# Patient Record
Sex: Female | Born: 1985 | Race: Black or African American | Hispanic: No | Smoking: Former smoker
Health system: Southern US, Community
[De-identification: ages and names within clinical notes are randomized; demographics above are authoritative.]

## PROBLEM LIST (undated history)

## (undated) ENCOUNTER — Inpatient Hospital Stay (HOSPITAL_COMMUNITY): Payer: Medicaid Other

## (undated) DIAGNOSIS — D219 Benign neoplasm of connective and other soft tissue, unspecified: Secondary | ICD-10-CM

## (undated) DIAGNOSIS — D509 Iron deficiency anemia, unspecified: Secondary | ICD-10-CM

## (undated) DIAGNOSIS — B999 Unspecified infectious disease: Secondary | ICD-10-CM

## (undated) DIAGNOSIS — L709 Acne, unspecified: Secondary | ICD-10-CM

## (undated) DIAGNOSIS — Z9289 Personal history of other medical treatment: Secondary | ICD-10-CM

## (undated) DIAGNOSIS — J9819 Other pulmonary collapse: Secondary | ICD-10-CM

## (undated) DIAGNOSIS — N939 Abnormal uterine and vaginal bleeding, unspecified: Secondary | ICD-10-CM

## (undated) DIAGNOSIS — F419 Anxiety disorder, unspecified: Secondary | ICD-10-CM

## (undated) DIAGNOSIS — E559 Vitamin D deficiency, unspecified: Secondary | ICD-10-CM

## (undated) DIAGNOSIS — M1990 Unspecified osteoarthritis, unspecified site: Secondary | ICD-10-CM

## (undated) DIAGNOSIS — F439 Reaction to severe stress, unspecified: Secondary | ICD-10-CM

## (undated) DIAGNOSIS — N83209 Unspecified ovarian cyst, unspecified side: Secondary | ICD-10-CM

## (undated) DIAGNOSIS — D649 Anemia, unspecified: Secondary | ICD-10-CM

## (undated) DIAGNOSIS — A749 Chlamydial infection, unspecified: Secondary | ICD-10-CM

## (undated) DIAGNOSIS — F32A Depression, unspecified: Secondary | ICD-10-CM

## (undated) DIAGNOSIS — F329 Major depressive disorder, single episode, unspecified: Secondary | ICD-10-CM

## (undated) HISTORY — DX: Abnormal uterine and vaginal bleeding, unspecified: N93.9

---
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
## 2012-12-19 ENCOUNTER — Encounter (HOSPITAL_COMMUNITY): Payer: Self-pay | Admitting: Emergency Medicine

## 2012-12-19 ENCOUNTER — Emergency Department (HOSPITAL_COMMUNITY)
Admission: EM | Admit: 2012-12-19 | Discharge: 2012-12-19 | Disposition: A | Discharge status: Home / Self Care | Payer: 59 | Attending: Emergency Medicine | Admitting: Emergency Medicine

## 2012-12-19 DIAGNOSIS — N760 Acute vaginitis: Secondary | ICD-10-CM | POA: Insufficient documentation

## 2012-12-19 DIAGNOSIS — D509 Iron deficiency anemia, unspecified: Secondary | ICD-10-CM | POA: Insufficient documentation

## 2012-12-19 DIAGNOSIS — Z975 Presence of (intrauterine) contraceptive device: Secondary | ICD-10-CM | POA: Insufficient documentation

## 2012-12-19 DIAGNOSIS — Z3202 Encounter for pregnancy test, result negative: Secondary | ICD-10-CM | POA: Insufficient documentation

## 2012-12-19 DIAGNOSIS — Z880 Allergy status to penicillin: Secondary | ICD-10-CM | POA: Insufficient documentation

## 2012-12-19 DIAGNOSIS — Z8709 Personal history of other diseases of the respiratory system: Secondary | ICD-10-CM | POA: Insufficient documentation

## 2012-12-19 DIAGNOSIS — B9689 Other specified bacterial agents as the cause of diseases classified elsewhere: Secondary | ICD-10-CM

## 2012-12-19 DIAGNOSIS — Z79899 Other long term (current) drug therapy: Secondary | ICD-10-CM | POA: Insufficient documentation

## 2012-12-19 DIAGNOSIS — N390 Urinary tract infection, site not specified: Secondary | ICD-10-CM

## 2012-12-19 DIAGNOSIS — F17200 Nicotine dependence, unspecified, uncomplicated: Secondary | ICD-10-CM | POA: Insufficient documentation

## 2012-12-19 HISTORY — DX: Iron deficiency anemia, unspecified: D50.9

## 2012-12-19 HISTORY — DX: Other pulmonary collapse: J98.19

## 2012-12-19 LAB — URINALYSIS, ROUTINE W REFLEX MICROSCOPIC
Bilirubin Urine: NEGATIVE
Leukocytes, UA: NEGATIVE
Nitrite: POSITIVE — AB
Protein, ur: NEGATIVE mg/dL
Specific Gravity, Urine: 1.023 (ref 1.005–1.030)
Urobilinogen, UA: 0.2 mg/dL (ref 0.0–1.0)
pH: 6.5 (ref 5.0–8.0)

## 2012-12-19 LAB — URINE MICROSCOPIC-ADD ON

## 2012-12-19 LAB — WET PREP, GENITAL: Trich, Wet Prep: NONE SEEN

## 2012-12-19 MED ORDER — NITROFURANTOIN MONOHYD MACRO 100 MG PO CAPS: 100.0000 mg | ORAL_CAPSULE | Freq: Two times a day (BID) | ORAL | Status: DC

## 2012-12-19 MED ORDER — METRONIDAZOLE 500 MG PO TABS: 500.0000 mg | ORAL_TABLET | Freq: Two times a day (BID) | ORAL | Status: DC

## 2012-12-19 MED ORDER — FLUCONAZOLE 150 MG PO TABS: 150.0000 mg | ORAL_TABLET | Freq: Once | ORAL | Status: AC | PRN

## 2012-12-19 NOTE — ED Provider Notes (Signed)
CSN: [id number]     Arrival date & time 12/19/12  1557 History   First MD Initiated Contact with Patient 12/19/12 1933     Chief Complaint  Patient presents with  . Vaginal Discharge   (Consider location/radiation/quality/duration/timing/severity/associated sxs/prior Treatment) Patient is a 27 y.o. female presenting with vaginal discharge.  Vaginal Discharge Quality:  Green Severity:  Moderate Onset quality:  Gradual Duration:  1 month Timing:  Intermittent Progression:  Worsening Chronicity:  New Context: spontaneously   Relieved by:  Nothing Worsened by:  Nothing tried Associated symptoms: abdominal pain (suprapubic), dysuria, urinary frequency and vaginal itching   Associated symptoms: no fever, no nausea and no vomiting     Past Medical History  Diagnosis Date  . Iron deficiency anemia   . Collapsed lung    Past Surgical History  Procedure Laterality Date  . Chest tube insertion     No family history on file. History  Substance Use Topics  . Smoking status: Current Every Day Smoker -- 0.50 packs/day    Types: Cigarettes  . Smokeless tobacco: Not on file  . Alcohol Use: Yes     Comment: socially   OB History   Grav Para Term Preterm Abortions TAB SAB Ect Mult Living                 Review of Systems  Constitutional: Negative for fever.  HENT: Negative for congestion.   Respiratory: Negative for cough and shortness of breath.   Cardiovascular: Negative for chest pain.  Gastrointestinal: Positive for abdominal pain (suprapubic). Negative for nausea, vomiting and diarrhea.  Genitourinary: Positive for dysuria and vaginal discharge.  All other systems reviewed and are negative.    Allergies  Penicillins  Home Medications   Current Outpatient Rx  Name  Route  Sig  Dispense  Refill  . ferrous sulfate 325 (65 FE) MG tablet   Oral   Take 325 mg by mouth daily with breakfast.         . PARAGARD INTRAUTERINE COPPER IU   Intrauterine   by Intrauterine  route.          BP 126/66  Pulse 97  Temp(Src) 98.2 F (36.8 C) (Oral)  Resp 16  SpO2 100%  LMP 11/26/2012 Physical Exam  Nursing note and vitals reviewed. Constitutional: She is oriented to person, place, and time. She appears well-developed and well-nourished. No distress.  HENT:  Head: Normocephalic and atraumatic.  Eyes: Conjunctivae are normal. No scleral icterus.  Neck: Neck supple.  Cardiovascular: Normal rate and intact distal pulses.   Pulmonary/Chest: Effort normal. No stridor. No respiratory distress.  Abdominal: Normal appearance. She exhibits no distension. There is tenderness (mild) in the right lower quadrant, suprapubic area and left lower quadrant. There is no rigidity, no rebound and no guarding.  Genitourinary: Uterus is tender. Cervix exhibits discharge. Cervix exhibits no motion tenderness and no friability. Right adnexum displays tenderness. Right adnexum displays no mass and no fullness. Left adnexum displays tenderness. Left adnexum displays no mass and no fullness.  nonfocal mild pelvic tenderness  Neurological: She is alert and oriented to person, place, and time.  Skin: Skin is warm and dry. No rash noted.  Psychiatric: She has a normal mood and affect. Her behavior is normal.    ED Course  Procedures (including critical care time) Labs Review Labs Reviewed  WET PREP, GENITAL - Abnormal; Notable for the following:    Clue Cells Wet Prep HPF POC FEW (*)    WBC,  Wet Prep HPF POC TOO NUMEROUS TO COUNT (*)    All other components within normal limits  URINALYSIS, ROUTINE W REFLEX MICROSCOPIC - Abnormal; Notable for the following:    APPearance CLOUDY (*)    Nitrite POSITIVE (*)    All other components within normal limits  URINE MICROSCOPIC-ADD ON - Abnormal; Notable for the following:    Bacteria, UA MANY (*)    All other components within normal limits  GC/CHLAMYDIA PROBE AMP  POCT PREGNANCY, URINE   Imaging Review No results found.  EKG  Interpretation   None       MDM   1. UTI (urinary tract infection)   2. BV (bacterial vaginosis)    Pt with vaginal discharge and nonfocal suprapubic pain.  No CMT, no friability.  No focal ovarian tenderness.  Exam not consistent with PID, Ov torsion, TOA, appendicitis, bowel obstruction.  She has a UTI and BV which likely explain her symptoms.  Will treat with Keflex and Metronidazole.      Candyce Churn, MD 12/19/12 2146

## 2012-12-19 NOTE — ED Notes (Signed)
Pt from home c/o brownish, white discharge with "fishy smell." x1 month. Pt adds that she has the Paraguard IUD and is wondering if this may be the cause. Pt also thinks she may have UTI because she c/o dysuria with frequency, and small amt of urinary incontinence when she sneezes ot laughs. Pt denies N/V/D/fever. Pt is A&O and in NAD

## 2012-12-20 LAB — GC/CHLAMYDIA PROBE AMP
CT Probe RNA: NEGATIVE
GC Probe RNA: NEGATIVE

## 2013-07-10 ENCOUNTER — Emergency Department (HOSPITAL_COMMUNITY)
Admission: EM | Admit: 2013-07-10 | Discharge: 2013-07-10 | Disposition: A | Discharge status: Home / Self Care | Payer: Medicaid Other | Attending: Emergency Medicine | Admitting: Emergency Medicine

## 2013-07-10 ENCOUNTER — Encounter (HOSPITAL_COMMUNITY): Payer: Self-pay | Admitting: Emergency Medicine

## 2013-07-10 DIAGNOSIS — D509 Iron deficiency anemia, unspecified: Secondary | ICD-10-CM | POA: Insufficient documentation

## 2013-07-10 DIAGNOSIS — B9689 Other specified bacterial agents as the cause of diseases classified elsewhere: Secondary | ICD-10-CM | POA: Insufficient documentation

## 2013-07-10 DIAGNOSIS — R110 Nausea: Secondary | ICD-10-CM | POA: Insufficient documentation

## 2013-07-10 DIAGNOSIS — Z3202 Encounter for pregnancy test, result negative: Secondary | ICD-10-CM | POA: Insufficient documentation

## 2013-07-10 DIAGNOSIS — K0889 Other specified disorders of teeth and supporting structures: Secondary | ICD-10-CM

## 2013-07-10 DIAGNOSIS — R109 Unspecified abdominal pain: Secondary | ICD-10-CM | POA: Insufficient documentation

## 2013-07-10 DIAGNOSIS — Z79899 Other long term (current) drug therapy: Secondary | ICD-10-CM | POA: Insufficient documentation

## 2013-07-10 DIAGNOSIS — Z880 Allergy status to penicillin: Secondary | ICD-10-CM | POA: Insufficient documentation

## 2013-07-10 DIAGNOSIS — K029 Dental caries, unspecified: Secondary | ICD-10-CM | POA: Insufficient documentation

## 2013-07-10 DIAGNOSIS — Z8709 Personal history of other diseases of the respiratory system: Secondary | ICD-10-CM | POA: Insufficient documentation

## 2013-07-10 DIAGNOSIS — F17200 Nicotine dependence, unspecified, uncomplicated: Secondary | ICD-10-CM | POA: Insufficient documentation

## 2013-07-10 DIAGNOSIS — K089 Disorder of teeth and supporting structures, unspecified: Secondary | ICD-10-CM | POA: Insufficient documentation

## 2013-07-10 DIAGNOSIS — N760 Acute vaginitis: Secondary | ICD-10-CM | POA: Insufficient documentation

## 2013-07-10 DIAGNOSIS — A499 Bacterial infection, unspecified: Secondary | ICD-10-CM | POA: Insufficient documentation

## 2013-07-10 LAB — URINE MICROSCOPIC-ADD ON

## 2013-07-10 LAB — URINALYSIS, ROUTINE W REFLEX MICROSCOPIC
Bilirubin Urine: NEGATIVE
GLUCOSE, UA: NEGATIVE mg/dL
Ketones, ur: NEGATIVE mg/dL
Leukocytes, UA: NEGATIVE
Nitrite: NEGATIVE
Protein, ur: NEGATIVE mg/dL
SPECIFIC GRAVITY, URINE: 1.013 (ref 1.005–1.030)
Urobilinogen, UA: 0.2 mg/dL (ref 0.0–1.0)
pH: 5.5 (ref 5.0–8.0)

## 2013-07-10 LAB — WET PREP, GENITAL
Trich, Wet Prep: NONE SEEN
YEAST WET PREP: NONE SEEN

## 2013-07-10 LAB — HIV ANTIBODY (ROUTINE TESTING W REFLEX): HIV: NONREACTIVE

## 2013-07-10 LAB — PREGNANCY, URINE: Preg Test, Ur: NEGATIVE

## 2013-07-10 MED ORDER — ACIDOPHILUS PROBIOTIC 100 MG PO CAPS: 100.0000 mg | ORAL_CAPSULE | Freq: Every day | ORAL | Status: DC

## 2013-07-10 MED ORDER — LIDOCAINE VISCOUS 2 % MT SOLN
15.0000 mL | Freq: Once | OROMUCOSAL | Status: AC
  Administered 2013-07-10: 15 mL via OROMUCOSAL
  Filled 2013-07-10: qty 15

## 2013-07-10 MED ORDER — LIDOCAINE VISCOUS 2 % MT SOLN: 20.0000 mL | OROMUCOSAL | Status: DC | PRN

## 2013-07-10 MED ORDER — NAPROXEN 500 MG PO TABS: 500.0000 mg | ORAL_TABLET | Freq: Two times a day (BID) | ORAL | Status: DC

## 2013-07-10 MED ORDER — METRONIDAZOLE 500 MG PO TABS: 500.0000 mg | ORAL_TABLET | Freq: Two times a day (BID) | ORAL | Status: DC

## 2013-07-10 MED ORDER — CLINDAMYCIN HCL 150 MG PO CAPS: 300.0000 mg | ORAL_CAPSULE | Freq: Three times a day (TID) | ORAL | Status: DC

## 2013-07-10 NOTE — ED Notes (Signed)
Multiple complaints. Pt reports pain to both sides of abd for several days. Having foul smelling vaginal discharge and nausea. Having dental pain, headaches and sore throat.

## 2013-07-10 NOTE — Discharge Instructions (Signed)
Please follow up with your primary care physician in 1-2 days. If you do not have one please call the [facility] number listed above. Please follow up with Ob/Gyn to schedule a follow up appointment. Please follow up with Dr. Radford Pax, dentistry, to schedule a follow up appointment.    Abdominal Pain, Women Abdominal (stomach, pelvic, or belly) pain can be caused by many things. It is important to tell your doctor:  The location of the pain.  Does it come and go or is it present all the time?  Are there things that start the pain (eating certain foods, exercise)?  Are there other symptoms associated with the pain (fever, nausea, vomiting, diarrhea)? All of this is helpful to know when trying to find the cause of the pain. CAUSES   Stomach: virus or bacteria infection, or ulcer.  Intestine: appendicitis (inflamed appendix), regional ileitis (Crohn's disease), ulcerative colitis (inflamed colon), irritable bowel syndrome, diverticulitis (inflamed diverticulum of the colon), or cancer of the stomach or intestine.  Gallbladder disease or stones in the gallbladder.  Kidney disease, kidney stones, or infection.  Pancreas infection or cancer.  Fibromyalgia (pain disorder).  Diseases of the female organs:  Uterus: fibroid (non-cancerous) tumors or infection.  Fallopian tubes: infection or tubal pregnancy.  Ovary: cysts or tumors.  Pelvic adhesions (scar tissue).  Endometriosis (uterus lining tissue growing in the pelvis and on the pelvic organs).  Pelvic congestion syndrome (female organs filling up with blood just before the menstrual period).  Pain with the menstrual period.  Pain with ovulation (producing an egg).  Pain with an IUD (intrauterine device, birth control) in the uterus.  Cancer of the female organs.  Functional pain (pain not caused by a disease, may improve without treatment).  Psychological pain.  Depression. DIAGNOSIS  Your doctor  will decide the seriousness of your pain by doing an examination.  Blood tests.  X-rays.  Ultrasound.  CT scan (computed tomography, special type of X-ray).  MRI (magnetic resonance imaging).  Cultures, for infection.  Barium enema (dye inserted in the large intestine, to better view it with X-rays).  Colonoscopy (looking in intestine with a lighted tube).  Laparoscopy (minor surgery, looking in abdomen with a lighted tube).  Major abdominal exploratory surgery (looking in abdomen with a large incision). TREATMENT  The treatment will depend on the cause of the pain.   Many cases can be observed and treated at home.  Over-the-counter medicines recommended by your caregiver.  Prescription medicine.  Antibiotics, for infection.  Birth control pills, for painful periods or for ovulation pain.  Hormone treatment, for endometriosis.  Nerve blocking injections.  Physical therapy.  Antidepressants.  Counseling with a psychologist or psychiatrist.  Minor or major surgery. HOME CARE INSTRUCTIONS   Do not take laxatives, unless directed by your caregiver.  Take over-the-counter pain medicine only if ordered by your caregiver. Do not take aspirin because it can cause an upset stomach or bleeding.  Try a clear liquid diet (broth or water) as ordered by your caregiver. Slowly move to a bland diet, as tolerated, if the pain is related to the stomach or intestine.  Have a thermometer and take your temperature several times a day, and record it.  Bed rest and sleep, if it helps the pain.  Avoid sexual intercourse, if it causes pain.  Avoid stressful situations.  Keep your follow-up appointments and tests, as your caregiver orders.  If the pain does not go away with medicine or surgery,  you may try:  Acupuncture.  Relaxation exercises (yoga, meditation).  Group therapy.  Counseling. SEEK MEDICAL CARE IF:   You notice certain foods cause stomach pain.  Your  home care treatment is not helping your pain.  You need stronger pain medicine.  You want your IUD removed.  You feel faint or lightheaded.  You develop nausea and vomiting.  You develop a rash.  You are having side effects or an allergy to your medicine. SEEK IMMEDIATE MEDICAL CARE IF:   Your pain does not go away or gets worse.  You have a fever.  Your pain is felt only in portions of the abdomen. The right side could possibly be appendicitis. The left lower portion of the abdomen could be colitis or diverticulitis.  You are passing blood in your stools (bright red or black tarry stools, with or without vomiting).  You have blood in your urine.  You develop chills, with or without a fever.  You pass out. MAKE SURE YOU:   Understand these instructions.  Will watch your condition.  Will get help right away if you are not doing well or get worse. Document Released: 12/01/2006 Document Revised: 04/28/2011 Document Reviewed: 12/21/2008 [facility] Patient Information 2014 [place], [place]. Bacterial Vaginosis Bacterial vaginosis is a vaginal infection that occurs when the normal balance of bacteria in the vagina is disrupted. It results from an overgrowth of certain bacteria. This is the most common vaginal infection in women of childbearing age. Treatment is important to prevent complications, especially in pregnant women, as it can cause a premature delivery. CAUSES  Bacterial vaginosis is caused by an increase in harmful bacteria that are normally present in smaller amounts in the vagina. Several different kinds of bacteria can cause bacterial vaginosis. However, the reason that the condition develops is not fully understood. RISK FACTORS Certain activities or behaviors can put you at an increased risk of developing bacterial vaginosis, including:  Having a new sex partner or multiple sex partners.  Douching.  Using an intrauterine device (IUD) for contraception. Women do  not get bacterial vaginosis from toilet seats, bedding, swimming pools, or contact with objects around them. SIGNS AND SYMPTOMS  Some women with bacterial vaginosis have no signs or symptoms. Common symptoms include:  Grey vaginal discharge.  A fishlike odor with discharge, especially after sexual intercourse.  Itching or burning of the vagina and vulva.  Burning or pain with urination. DIAGNOSIS  Your health care provider will take a medical history and examine the vagina for signs of bacterial vaginosis. A sample of vaginal fluid may be taken. Your health care provider will look at this sample under a microscope to check for bacteria and abnormal cells. A vaginal pH test may also be done.  TREATMENT  Bacterial vaginosis may be treated with antibiotic medicines. These may be given in the form of a pill or a vaginal cream. A second round of antibiotics may be prescribed if the condition comes back after treatment.  HOME CARE INSTRUCTIONS   Only take over-the-counter or prescription medicines as directed by your health care provider.  If antibiotic medicine was prescribed, take it as directed. Make sure you finish it even if you start to feel better.  Do not have sex until treatment is completed.  Tell all sexual partners that you have a vaginal infection. They should see their health care provider and be treated if they have problems, such as a mild rash or itching.  Practice safe sex by using condoms and  only having one sex partner. SEEK MEDICAL CARE IF:   Your symptoms are not improving after 3 days of treatment.  You have increased discharge or pain.  You have a fever. MAKE SURE YOU:   Understand these instructions.  Will watch your condition.  Will get help right away if you are not doing well or get worse. FOR MORE INFORMATION  Centers for Disease Control and Prevention, Division of STD Prevention: Appraiser[employer]raud[employer]fi American Sexual Health Association (ASHA):  www.ashastd.org  Document Released: 02/03/2005 Document Revised: 11/24/2012 Document Reviewed: 09/15/2012 [facility] Patient Information 2014 [place]. Dental Pain A tooth ache may be caused by cavities (tooth decay). Cavities expose the nerve of the tooth to air and hot or cold temperatures. It may come from an infection or abscess (also called a boil or furuncle) around your tooth. It is also often caused by dental caries (tooth decay). This causes the pain you are having. DIAGNOSIS  Your caregiver can diagnose this problem by exam. TREATMENT   If caused by an infection, it may be treated with medications which kill germs (antibiotics) and pain medications as prescribed by your caregiver. Take medications as directed.  Only take over-the-counter or prescription medicines for pain, discomfort, or fever as directed by your caregiver.  Whether the tooth ache today is caused by infection or dental disease, you should see your dentist as soon as possible for further care. SEEK MEDICAL CARE IF: The exam and treatment you received today has been provided on an emergency basis only. This is not a substitute for complete medical or dental care. If your problem worsens or new problems (symptoms) appear, and you are unable to meet with your dentist, call or return to this location. SEEK IMMEDIATE MEDICAL CARE IF:   You have a fever.  You develop redness and swelling of your face, jaw, or neck.  You are unable to open your mouth.  You have severe pain uncontrolled by pain medicine. MAKE SURE YOU:   Understand these instructions.  Will watch your condition.  Will get help right away if you are not doing well or get worse. Document Released: 02/03/2005 Document Revised: 04/28/2011 Document Reviewed: 09/22/2007 [facility] Patient Information 2014 [place].

## 2013-07-10 NOTE — ED Provider Notes (Signed)
CSN: [id number]     Arrival date & time 07/10/13  1126 History   First MD Initiated Contact with Patient 07/10/13 1156     Chief Complaint  Patient presents with  . Abdominal Pain  . Vaginal Discharge     (Consider location/radiation/quality/duration/timing/severity/associated sxs/prior Treatment) HPI Comments: Patient is a G75 P737 28 year old female presenting to the emergency department for 2 complaints. Patient's first complaint is 4 days of lower abdominal cramping with associated foul smelling vaginal discharge and nausea. Alleviating factors: none. Aggravating factors: none. Medications tried prior to arrival: Motrin. Patient states he has a history of bacterial vaginosis infections and this does not seem similar. Patient has a ParaGard IUD placed (2013). No recent unprotected sexual intercourse with a partner of unknown status. Patient's second complaint is one month of gradually worsening left lower dental pain. Patient states broke a filling time and since then has had increased piercing throbbing pain with radiation to the throat and is causing a headache. Alleviating factors: Motrin. Aggravating factors: eating and drinking. Medications tried prior to arrival: Motrin, Naproxen. Denies any fevers, chills, vomiting, diarrhea, constipation. No abdominal surgical history. LMP beginning of May.    Patient is a 28 y.o. female presenting with abdominal pain and vaginal discharge.  Abdominal Pain Associated symptoms: nausea and vaginal discharge   Associated symptoms: no chills, no constipation, no diarrhea, no dysuria, no fever, no hematuria, no shortness of breath and no vomiting   Vaginal Discharge Associated symptoms: abdominal pain and nausea   Associated symptoms: no dysuria, no fever and no vomiting     Past Medical History  Diagnosis Date  . Iron deficiency anemia   . Collapsed lung    Past Surgical History  Procedure Laterality Date  . Chest tube insertion     History  reviewed. No pertinent family history. History  Substance Use Topics  . Smoking status: Current Every Day Smoker -- 0.50 packs/day    Types: Cigarettes  . Smokeless tobacco: Not on file  . Alcohol Use: Yes     Comment: socially   OB History   Grav Para Term Preterm Abortions TAB SAB Ect Mult Living                 Review of Systems  Constitutional: Negative for fever and chills.  Respiratory: Negative for shortness of breath.   Gastrointestinal: Positive for nausea and abdominal pain. Negative for vomiting, diarrhea and constipation.  Genitourinary: Positive for vaginal discharge. Negative for dysuria, urgency, hematuria, flank pain and vaginal pain.  All other systems reviewed and are negative.     Allergies  Penicillins  Home Medications   Prior to Admission medications   Medication Sig Start Date End Date Taking? Authorizing Provider  ferrous sulfate 325 (65 FE) MG tablet Take 325 mg by mouth daily with breakfast.    Historical Provider, MD  metroNIDAZOLE (FLAGYL) 500 MG tablet Take 1 tablet (500 mg total) by mouth 2 (two) times daily. 12/19/12   Houston Siren III, MD  nitrofurantoin, macrocrystal-monohydrate, (MACROBID) 100 MG capsule Take 1 capsule (100 mg total) by mouth 2 (two) times daily. 12/19/12   Houston Siren III, MD  PARAGARD INTRAUTERINE COPPER IU by Intrauterine route.    Historical Provider, MD   BP 142/71  Pulse 90  Temp(Src) 98.9 F (37.2 C) (Oral)  Resp 18  Ht 5\' 5"  (1.651 m)  SpO2 100%  LMP 06/19/2013 Physical Exam  Nursing note and vitals reviewed. Constitutional: She is oriented to person,  place, and time. She appears well-developed and well-nourished. No distress.  HENT:  Head: Normocephalic and atraumatic.  Right Ear: Hearing, tympanic membrane, external ear and ear canal normal.  Left Ear: Hearing, tympanic membrane, external ear and ear canal normal.  Nose: Nose normal.  Mouth/Throat: Uvula is midline, oropharynx is clear and  moist and mucous membranes are normal. No trismus in the jaw. Abnormal dentition. Dental caries present. No dental abscesses or uvula swelling. No oropharyngeal exudate.    Eyes: Conjunctivae are normal.  Neck: Normal range of motion. Neck supple.  Cardiovascular: Normal rate, regular rhythm and normal heart sounds.   Pulmonary/Chest: Effort normal and breath sounds normal. No respiratory distress.  Abdominal: Soft. Bowel sounds are normal. She exhibits no distension. There is no tenderness. There is no rebound and no guarding.  Musculoskeletal: Normal range of motion. She exhibits no edema.  Neurological: She is alert and oriented to person, place, and time.  Skin: Skin is warm and dry. She is not diaphoretic.  Psychiatric: She has a normal mood and affect.   Exam performed by Harlow Mares,  exam chaperoned Date: 07/10/2013 Pelvic exam: normal external genitalia without evidence of trauma. VULVA: normal appearing vulva with no masses, tenderness or lesion. VAGINA: normal appearing vagina with normal color and discharge, no lesions. CERVIX: normal appearing cervix without lesions, cervical motion tenderness absent, cervical os closed with out purulent discharge; vaginal discharge - copious, green and malodorous, Wet prep and DNA probe for chlamydia and GC obtained.   ADNEXA: normal adnexa in size, nontender and no masses UTERUS: uterus is normal size, shape, consistency and nontender.   ED Course  Procedures (including critical care time) Medications  lidocaine (XYLOCAINE) 2 % viscous mouth solution 15 mL (15 mLs Mouth/Throat Given 07/10/13 1316)    Labs Review Labs Reviewed  WET PREP, GENITAL - Abnormal; Notable for the following:    Clue Cells Wet Prep HPF POC MANY (*)    WBC, Wet Prep HPF POC MODERATE (*)    All other components within normal limits  URINALYSIS, ROUTINE W REFLEX MICROSCOPIC - Abnormal; Notable for the following:    APPearance CLOUDY (*)    Hgb urine  dipstick SMALL (*)    All other components within normal limits  URINE MICROSCOPIC-ADD ON - Abnormal; Notable for the following:    Squamous Epithelial / LPF MANY (*)    Bacteria, UA FEW (*)    All other components within normal limits  GC/CHLAMYDIA PROBE AMP  PREGNANCY, URINE  HIV ANTIBODY (ROUTINE TESTING)    Imaging Review No results found.   EKG Interpretation None      MDM   Final diagnoses:  Bacterial vaginosis  Pain, dental  Abdominal pain    Filed Vitals:   07/10/13 1400  BP: 142/71  Pulse: 90  Temp:   Resp: 18   Afebrile, NAD, non-toxic appearing, AAOx4.   1) Abdominal pain: Patient has not been sepsis research criteria. Patient is not ill-appearing. Abdomen is soft, nontender, nondistended. No peritoneal signs. No focal tenderness to suggest appendicitis. o indication of appendicitis, bowel obstruction, bowel perforation, cholecystitis, diverticulitis, PID or ectopic pregnancy. Pelvic exam with copious green discharge. There is no cervical motion tenderness, adnexal fullness or tenderness. Cervical os is closed. No indications of pelvic inflammatory disease. Patient will risk for GC/Chlamydia, the test is pending will not prophylactically treat this time. Wet prep does reveal bacterial vaginosis. Advised patient f/u with Ob/Gyn. Discussed that HIV, GC/Chlamydia will take 48 hours to  return and she would be notified of any positive results at that time and would need to return for treatment. She is agreeable to this plan.   2) Dental pain: Patient with toothache.  No gross abscess.  Exam unconcerning for Ludwig's angina or spread of infection.  Will treat with clindamycin d/t penicillin alelrgy and pain medicine.  Urged patient to follow-up with dentist.     Patient discharged home with symptomatic treatment and given strict instructions for follow-up with their primary care physician.  I have also discussed reasons to return immediately to the ER.  Patient expresses  understanding and agrees with plan.      Harlow Mares, PA-C 07/10/13 1712

## 2013-07-11 LAB — GC/CHLAMYDIA PROBE AMP
CT Probe RNA: NEGATIVE
GC Probe RNA: NEGATIVE

## 2013-07-13 NOTE — ED Provider Notes (Signed)
Medical screening examination/treatment/procedure(s) were performed by non-physician practitioner and as supervising physician I was immediately available for consultation/collaboration.   EKG Interpretation None       Pete Schnitzer, MD 07/13/13 0702 

## 2014-02-21 ENCOUNTER — Emergency Department (HOSPITAL_COMMUNITY)
Admission: EM | Admit: 2014-02-21 | Discharge: 2014-02-21 | Disposition: A | Discharge status: Home / Self Care | Payer: Medicaid Other | Attending: Emergency Medicine | Admitting: Emergency Medicine

## 2014-02-21 ENCOUNTER — Encounter (HOSPITAL_COMMUNITY): Payer: Self-pay | Admitting: Emergency Medicine

## 2014-02-21 DIAGNOSIS — Z880 Allergy status to penicillin: Secondary | ICD-10-CM | POA: Insufficient documentation

## 2014-02-21 DIAGNOSIS — Z791 Long term (current) use of non-steroidal anti-inflammatories (NSAID): Secondary | ICD-10-CM | POA: Diagnosis not present

## 2014-02-21 DIAGNOSIS — Z862 Personal history of diseases of the blood and blood-forming organs and certain disorders involving the immune mechanism: Secondary | ICD-10-CM | POA: Diagnosis not present

## 2014-02-21 DIAGNOSIS — R05 Cough: Secondary | ICD-10-CM | POA: Diagnosis present

## 2014-02-21 DIAGNOSIS — Z720 Tobacco use: Secondary | ICD-10-CM | POA: Diagnosis not present

## 2014-02-21 DIAGNOSIS — H5713 Ocular pain, bilateral: Secondary | ICD-10-CM | POA: Diagnosis not present

## 2014-02-21 DIAGNOSIS — J069 Acute upper respiratory infection, unspecified: Secondary | ICD-10-CM | POA: Diagnosis not present

## 2014-02-21 DIAGNOSIS — Z79899 Other long term (current) drug therapy: Secondary | ICD-10-CM | POA: Insufficient documentation

## 2014-02-21 DIAGNOSIS — Z792 Long term (current) use of antibiotics: Secondary | ICD-10-CM | POA: Insufficient documentation

## 2014-02-21 MED ORDER — ALBUTEROL SULFATE HFA 108 (90 BASE) MCG/ACT IN AERS
2.0000 | INHALATION_SPRAY | Freq: Once | RESPIRATORY_TRACT | Status: AC
  Administered 2014-02-21: 2 via RESPIRATORY_TRACT
  Filled 2014-02-21: qty 6.7

## 2014-02-21 MED ORDER — AZITHROMYCIN 250 MG PO TABS: 250.0000 mg | ORAL_TABLET | Freq: Every day | ORAL | Status: DC

## 2014-02-21 MED ORDER — DEXTROMETHORPHAN POLISTIREX 30 MG/5ML PO LQCR: 30.0000 mg | ORAL | Status: DC | PRN

## 2014-02-21 NOTE — ED Provider Notes (Signed)
CSN: [id number]     Arrival date & time 02/21/14  9147 History  This chart was scribed for non-physician practitioner, Alvina Chou, PA-C, working with Virgel Manifold, MD, by Stephania Fragmin, ED Scribe. This patient was seen in room TR07C/TR07C and the patient's care was started at 9:32 AM.    Chief Complaint  Patient presents with  . URI    Patient is a 29 y.o. female presenting with URI. The history is provided by the patient and medical records. No language interpreter was used.  URI Presenting symptoms: congestion, cough, fatigue and sore throat   Presenting symptoms: no fever   Associated symptoms: headaches      HPI Comments: Frances Pearson is a 29 y.o. female who presents to the Emergency Department complaining of constant productive cough with brown-yellow sputum and nasal congestion with yellow-green discharge that began 5 days ago. Patient originally had a sore throat, headache, and fatigue which all resolved after 1-2 days. She complains of associated pain behind her eyes, as well as wheezing. She has tried Mucinex DM with minimal relief. Patient also reports having mold in her house, suspecting that they may have contributed to her infection. She is a current smoker (0.5 PPD, per medical records). She denies fever. She also denies a history of asthma.   Past Medical History  Diagnosis Date  . Iron deficiency anemia   . Collapsed lung    Past Surgical History  Procedure Laterality Date  . Chest tube insertion     History reviewed. No pertinent family history. History  Substance Use Topics  . Smoking status: Current Every Day Smoker -- 0.50 packs/day    Types: Cigarettes  . Smokeless tobacco: Not on file  . Alcohol Use: Yes     Comment: socially   OB History    No data available     Review of Systems  Constitutional: Positive for fatigue. Negative for fever.  HENT: Positive for congestion and sore throat.   Respiratory: Positive for cough.   Neurological: Positive for  headaches.  All other systems reviewed and are negative.     Allergies  Penicillins  Home Medications   Prior to Admission medications   Medication Sig Start Date End Date Taking? Authorizing Provider  clindamycin (CLEOCIN) 150 MG capsule Take 2 capsules (300 mg total) by mouth 3 (three) times daily. May dispense as 150mg  capsules 07/10/13   Jennifer L Piepenbrink, PA-C  diphenhydrAMINE (BENADRYL) 25 MG tablet Take 50 mg by mouth every 6 (six) hours as needed.    Historical Provider, MD  ibuprofen (ADVIL,MOTRIN) 200 MG tablet Take 600 mg by mouth every 6 (six) hours as needed.    Historical Provider, MD  Lactobacillus (ACIDOPHILUS PROBIOTIC) 100 MG CAPS Take 1 capsule (100 mg total) by mouth daily. 07/10/13   Jennifer L Piepenbrink, PA-C  lidocaine (XYLOCAINE) 2 % solution Use as directed 20 mLs in the mouth or throat as needed for mouth pain. 07/10/13   Jennifer L Piepenbrink, PA-C  metroNIDAZOLE (FLAGYL) 500 MG tablet Take 1 tablet (500 mg total) by mouth 2 (two) times daily. 07/10/13   Jennifer L Piepenbrink, PA-C  Multiple Vitamin (MULTIVITAMIN WITH MINERALS) TABS tablet Take 1 tablet by mouth daily.    Historical Provider, MD  naproxen (NAPROSYN) 500 MG tablet Take 1 tablet (500 mg total) by mouth 2 (two) times daily with a meal. 07/10/13   Jennifer L Piepenbrink, PA-C  naproxen sodium (ANAPROX) 220 MG tablet Take 220 mg by mouth 2 (two) times  daily with a meal.    Historical Provider, MD  PARAGARD INTRAUTERINE COPPER IU by Intrauterine route.    Historical Provider, MD   BP 112/74 mmHg  Pulse 82  Temp(Src) 97.4 F (36.3 C) (Oral)  Resp 18  SpO2 100%  LMP 02/18/2014 Physical Exam  Constitutional: She is oriented to person, place, and time. She appears well-developed and well-nourished. No distress.  HENT:  Head: Normocephalic and atraumatic.  Mouth/Throat: Oropharynx is clear and moist. No oropharyngeal exudate.  Eyes: Conjunctivae and EOM are normal.  Neck: Neck supple. No  tracheal deviation present.  Cardiovascular: Normal rate.   Pulmonary/Chest: Effort normal. No respiratory distress. She has wheezes.  Mild inspiratory wheezes in bilateral apices. Prominent inspiratory wheezing in bilateral bases.   Abdominal: Soft.  Musculoskeletal: Normal range of motion.  Neurological: She is alert and oriented to person, place, and time.  Skin: Skin is warm and dry.  Psychiatric: She has a normal mood and affect. Her behavior is normal.  Nursing note and vitals reviewed.   ED Course  Procedures (including critical care time)  DIAGNOSTIC STUDIES: Oxygen Saturation is 100% on room air, normal by my interpretation.    COORDINATION OF CARE: 9:35 AM - Discussed treatment plan with pt at bedside which includes antibiotics in the case of infection, and an inhaler, and pt agreed to plan.   MDM   Final diagnoses:  URI (upper respiratory infection)   Patient will have azithromycin for sinus infection. Patient will have delsym for cough. Vitals stable and patient afebrile. No further evaluation needed at this time.   I personally performed the services described in this documentation, which was scribed in my presence. The recorded information has been reviewed and is accurate.     Alvina Chou, PA-C 02/21/14 [place], MD 02/22/14 [phone]

## 2014-02-21 NOTE — ED Notes (Addendum)
PT reports cough, nasal congestion. Denies fevers. Yellow phlegm. Headache and pain behind eyes.

## 2014-02-21 NOTE — ED Notes (Signed)
Cough and congestion x 1 week. Productive cough, green/yellow sputum. Denies CP.

## 2014-02-21 NOTE — Discharge Instructions (Signed)
Take Azithromycin as directed until gone. Refer to attached documents for more information. Take delsym as needed for cough.

## 2014-06-11 ENCOUNTER — Encounter (HOSPITAL_COMMUNITY): Payer: Self-pay | Admitting: *Deleted

## 2014-06-11 ENCOUNTER — Emergency Department (HOSPITAL_COMMUNITY)
Admission: EM | Admit: 2014-06-11 | Discharge: 2014-06-11 | Disposition: A | Discharge status: Home / Self Care | Payer: Medicaid Other | Attending: Emergency Medicine | Admitting: Emergency Medicine

## 2014-06-11 DIAGNOSIS — B9689 Other specified bacterial agents as the cause of diseases classified elsewhere: Secondary | ICD-10-CM

## 2014-06-11 DIAGNOSIS — Z79899 Other long term (current) drug therapy: Secondary | ICD-10-CM | POA: Insufficient documentation

## 2014-06-11 DIAGNOSIS — N898 Other specified noninflammatory disorders of vagina: Secondary | ICD-10-CM | POA: Diagnosis present

## 2014-06-11 DIAGNOSIS — Z3202 Encounter for pregnancy test, result negative: Secondary | ICD-10-CM | POA: Insufficient documentation

## 2014-06-11 DIAGNOSIS — Z880 Allergy status to penicillin: Secondary | ICD-10-CM | POA: Insufficient documentation

## 2014-06-11 DIAGNOSIS — Z792 Long term (current) use of antibiotics: Secondary | ICD-10-CM | POA: Diagnosis not present

## 2014-06-11 DIAGNOSIS — Z862 Personal history of diseases of the blood and blood-forming organs and certain disorders involving the immune mechanism: Secondary | ICD-10-CM | POA: Insufficient documentation

## 2014-06-11 DIAGNOSIS — N760 Acute vaginitis: Secondary | ICD-10-CM | POA: Insufficient documentation

## 2014-06-11 DIAGNOSIS — Z8709 Personal history of other diseases of the respiratory system: Secondary | ICD-10-CM | POA: Insufficient documentation

## 2014-06-11 DIAGNOSIS — Z791 Long term (current) use of non-steroidal anti-inflammatories (NSAID): Secondary | ICD-10-CM | POA: Insufficient documentation

## 2014-06-11 DIAGNOSIS — Z720 Tobacco use: Secondary | ICD-10-CM | POA: Insufficient documentation

## 2014-06-11 LAB — URINALYSIS, ROUTINE W REFLEX MICROSCOPIC
BILIRUBIN URINE: NEGATIVE
GLUCOSE, UA: NEGATIVE mg/dL
Hgb urine dipstick: NEGATIVE
KETONES UR: NEGATIVE mg/dL
Leukocytes, UA: NEGATIVE
NITRITE: POSITIVE — AB
Protein, ur: NEGATIVE mg/dL
SPECIFIC GRAVITY, URINE: 1.028 (ref 1.005–1.030)
Urobilinogen, UA: 0.2 mg/dL (ref 0.0–1.0)
pH: 5.5 (ref 5.0–8.0)

## 2014-06-11 LAB — WET PREP, GENITAL
Trich, Wet Prep: NONE SEEN
Yeast Wet Prep HPF POC: NONE SEEN

## 2014-06-11 LAB — POC URINE PREG, ED: PREG TEST UR: NEGATIVE

## 2014-06-11 LAB — URINE MICROSCOPIC-ADD ON

## 2014-06-11 MED ORDER — METRONIDAZOLE 0.75 % EX GEL: 1.0000 "application " | Freq: Two times a day (BID) | CUTANEOUS | Status: AC

## 2014-06-11 MED ORDER — FLUCONAZOLE 150 MG PO TABS: 150.0000 mg | ORAL_TABLET | Freq: Once | ORAL | Status: DC

## 2014-06-11 NOTE — ED Notes (Signed)
Pt assessed and no acute distress. Aware of waiting time.

## 2014-06-11 NOTE — ED Notes (Signed)
Urine sent to mini lab.

## 2014-06-11 NOTE — Discharge Instructions (Signed)
Please follow the directions provided.  Be sure to follow-up with your primary care provider to ensure you are getting better.  Use the metronidazole cream as directed.  Don't hesitate to return for any new, worsening or concerning symptoms.    SEEK MEDICAL CARE IF:  Your symptoms are not improving after 3 days of treatment.  You have increased discharge or pain.  You have a fever.

## 2014-06-11 NOTE — ED Notes (Signed)
Pt reports foul vaginal discharge and odor. History of BV. Pt also thinks she has ear infection. No distress noted at triage.

## 2014-06-11 NOTE — ED Notes (Signed)
Pelvic Cart Set Up at bedside.

## 2014-06-11 NOTE — ED Provider Notes (Signed)
CSN: [id number]     Arrival date & time 06/11/14  1322 History   First MD Initiated Contact with Patient 06/11/14 1605     Chief Complaint  Patient presents with  . Vaginal Discharge   (Consider location/radiation/quality/duration/timing/severity/associated sxs/prior Treatment) HPI  Frances Pearson is a 29 yo female presenting with report of vaginal discharge.  She is married but currently separated and has not been with another partner in the last month since the separation.  She states the vaginal discharge and odor has been intermittently ongoing for several months but this most recent episode started appr 1 week ago.  She describes the odor as a "fishy" smell and reports the discharge as whitish brown. She has a paragard IUD and her LMP ended 3 days ago. She denies any abd pain or vaginal pain.  She also denies fever,s chills, nausea, vomiting or diarrhea.   Past Medical History  Diagnosis Date  . Iron deficiency anemia   . Collapsed lung    Past Surgical History  Procedure Laterality Date  . Chest tube insertion     History reviewed. No pertinent family history. History  Substance Use Topics  . Smoking status: Current Every Day Smoker -- 0.50 packs/day    Types: Cigarettes  . Smokeless tobacco: Not on file  . Alcohol Use: Yes     Comment: socially   OB History    No data available     Review of Systems  Constitutional: Negative for fever and chills.  HENT: Negative for sore throat.   Eyes: Negative for visual disturbance.  Respiratory: Negative for cough and shortness of breath.   Cardiovascular: Negative for chest pain and leg swelling.  Gastrointestinal: Negative for nausea, vomiting and diarrhea.  Genitourinary: Positive for vaginal discharge. Negative for dysuria, vaginal pain and pelvic pain.  Musculoskeletal: Negative for myalgias.  Skin: Negative for rash.  Neurological: Negative for weakness, numbness and headaches.    Allergies  Penicillins  Home  Medications   Prior to Admission medications   Medication Sig Start Date End Date Taking? Authorizing Provider  azithromycin (ZITHROMAX Z-PAK) 250 MG tablet Take 1 tablet (250 mg total) by mouth daily. 500mg  PO day 1, then 250mg  PO days 205 02/21/14   Kaitlyn Szekalski, PA-C  clindamycin (CLEOCIN) 150 MG capsule Take 2 capsules (300 mg total) by mouth 3 (three) times daily. May dispense as 150mg  capsules 07/10/13   Jennifer Piepenbrink, PA-C  dextromethorphan (DELSYM) 30 MG/5ML liquid Take 5 mLs (30 mg total) by mouth as needed for cough. 02/21/14   Kaitlyn Szekalski, PA-C  diphenhydrAMINE (BENADRYL) 25 MG tablet Take 50 mg by mouth every 6 (six) hours as needed.    Historical Provider, MD  ibuprofen (ADVIL,MOTRIN) 200 MG tablet Take 600 mg by mouth every 6 (six) hours as needed.    Historical Provider, MD  Lactobacillus (ACIDOPHILUS PROBIOTIC) 100 MG CAPS Take 1 capsule (100 mg total) by mouth daily. 07/10/13   Jennifer Piepenbrink, PA-C  lidocaine (XYLOCAINE) 2 % solution Use as directed 20 mLs in the mouth or throat as needed for mouth pain. 07/10/13   Jennifer Piepenbrink, PA-C  metroNIDAZOLE (FLAGYL) 500 MG tablet Take 1 tablet (500 mg total) by mouth 2 (two) times daily. 07/10/13   Baron Sane, PA-C  Multiple Vitamin (MULTIVITAMIN WITH MINERALS) TABS tablet Take 1 tablet by mouth daily.    Historical Provider, MD  naproxen (NAPROSYN) 500 MG tablet Take 1 tablet (500 mg total) by mouth 2 (two) times daily with a meal.  07/10/13   Jennifer Piepenbrink, PA-C  naproxen sodium (ANAPROX) 220 MG tablet Take 220 mg by mouth 2 (two) times daily with a meal.    Historical Provider, MD  PARAGARD INTRAUTERINE COPPER IU by Intrauterine route.    Historical Provider, MD   BP 120/60 mmHg  Pulse 83  Temp(Src) 98.1 F (36.7 C)  Resp 18  Ht 5\' 5"  (1.651 m)  Wt 144 lb (65.318 kg)  BMI 23.96 kg/m2  SpO2 100%  LMP 06/04/2014 Physical Exam  Constitutional: She appears well-developed and well-nourished. No  distress.  HENT:  Head: Normocephalic and atraumatic.  Mouth/Throat: Oropharynx is clear and moist.  Eyes: Conjunctivae are normal.  Neck: Neck supple.  Cardiovascular: Normal rate, regular rhythm and intact distal pulses.   Pulmonary/Chest: Effort normal and breath sounds normal. No respiratory distress.  Abdominal: Soft. She exhibits no distension and no mass. There is no tenderness. There is no rebound and no guarding.  Genitourinary: There is no rash or tenderness on the right labia. There is no rash or tenderness on the left labia. Cervix exhibits no motion tenderness, no discharge and no friability. Right adnexum displays no tenderness. Left adnexum displays no tenderness. Vaginal discharge found.  Thin, white discharge noted in vaginal vault, amine odor noted.   Musculoskeletal: She exhibits no tenderness.  Lymphadenopathy:    She has no cervical adenopathy.  Neurological: She is alert.  Skin: Skin is warm and dry. No rash noted. She is not diaphoretic.  Psychiatric: She has a normal mood and affect.  Nursing note and vitals reviewed.   ED Course  Procedures (including critical care time) Labs Review Labs Reviewed  WET PREP, GENITAL - Abnormal; Notable for the following:    Clue Cells Wet Prep HPF POC FEW (*)    WBC, Wet Prep HPF POC FEW (*)    All other components within normal limits  URINALYSIS, ROUTINE W REFLEX MICROSCOPIC - Abnormal; Notable for the following:    APPearance CLOUDY (*)    Nitrite POSITIVE (*)    All other components within normal limits  URINE MICROSCOPIC-ADD ON - Abnormal; Notable for the following:    Squamous Epithelial / LPF FEW (*)    Bacteria, UA MANY (*)    All other components within normal limits  POC URINE PREG, ED  GC/CHLAMYDIA PROBE AMP ([place])    Imaging Review No results found.   EKG Interpretation None      MDM   Final diagnoses:  Bacterial vaginosis   29 yo with vaginal discharge Discussed importance of using  protection when sexually active. Pt understands that they have GC/Chlamydia cultures pending and that they will need to inform all sexual partners if results return positive. Pt has been treated for bacterial vaginosis based on quality of vaginal discharge and clue cells on wet prep. Prescription provided for intravaginal flagyl for Bacterial Vaginosis. Pt not concerning for PID because hemodynamically stable and no cervical motion tenderness on pelvic exam. .Pt is well-appearing, in no acute distress and vital signs reviewed and not concerning. She appears safe to be discharged.  Return precautions provided. Pt aware of plan and in agreement.   Filed Vitals:   06/11/14 1630 06/11/14 1645 06/11/14 1700 06/11/14 1756  BP: 116/69 112/73 109/63 117/73  Pulse: 67 64 65 78  Temp:      Resp:   16 16  Height:      Weight:      SpO2: 100% 100% 100% 100%    Meds  given in ED:  Medications - No data to display  Discharge Medication List as of 06/11/2014  5:53 PM    START taking these medications   Details  metroNIDAZOLE (METROGEL) 0.75 % gel Apply 1 application topically 2 (two) times daily. 1 application vaginally, twice a day for 5 days., Starting 06/11/2014, Until Fri 06/16/14, Print           Britt Bottom, NP 06/12/14 0947  Tanna Furry, MD 06/20/14 [phone]

## 2014-06-12 LAB — GC/CHLAMYDIA PROBE AMP (~~LOC~~) NOT AT ARMC
Chlamydia: NEGATIVE
Neisseria Gonorrhea: NEGATIVE

## 2015-01-26 ENCOUNTER — Emergency Department (HOSPITAL_COMMUNITY)
Admission: EM | Admit: 2015-01-26 | Discharge: 2015-01-26 | Disposition: A | Discharge status: Home / Self Care | Payer: Medicaid Other | Attending: Emergency Medicine | Admitting: Emergency Medicine

## 2015-01-26 ENCOUNTER — Encounter (HOSPITAL_COMMUNITY): Payer: Self-pay | Admitting: Emergency Medicine

## 2015-01-26 DIAGNOSIS — Z8639 Personal history of other endocrine, nutritional and metabolic disease: Secondary | ICD-10-CM | POA: Diagnosis not present

## 2015-01-26 DIAGNOSIS — N760 Acute vaginitis: Secondary | ICD-10-CM | POA: Insufficient documentation

## 2015-01-26 DIAGNOSIS — F17210 Nicotine dependence, cigarettes, uncomplicated: Secondary | ICD-10-CM | POA: Insufficient documentation

## 2015-01-26 DIAGNOSIS — B9689 Other specified bacterial agents as the cause of diseases classified elsewhere: Secondary | ICD-10-CM

## 2015-01-26 DIAGNOSIS — Z3202 Encounter for pregnancy test, result negative: Secondary | ICD-10-CM | POA: Insufficient documentation

## 2015-01-26 DIAGNOSIS — N898 Other specified noninflammatory disorders of vagina: Secondary | ICD-10-CM | POA: Diagnosis present

## 2015-01-26 DIAGNOSIS — Z79899 Other long term (current) drug therapy: Secondary | ICD-10-CM | POA: Diagnosis not present

## 2015-01-26 DIAGNOSIS — Z8709 Personal history of other diseases of the respiratory system: Secondary | ICD-10-CM | POA: Diagnosis not present

## 2015-01-26 DIAGNOSIS — Z880 Allergy status to penicillin: Secondary | ICD-10-CM | POA: Diagnosis not present

## 2015-01-26 DIAGNOSIS — D509 Iron deficiency anemia, unspecified: Secondary | ICD-10-CM | POA: Diagnosis not present

## 2015-01-26 HISTORY — DX: Vitamin D deficiency, unspecified: E55.9

## 2015-01-26 LAB — POC URINE PREG, ED: PREG TEST UR: NEGATIVE

## 2015-01-26 LAB — URINALYSIS, ROUTINE W REFLEX MICROSCOPIC
Bilirubin Urine: NEGATIVE
Glucose, UA: NEGATIVE mg/dL
HGB URINE DIPSTICK: NEGATIVE
Ketones, ur: NEGATIVE mg/dL
LEUKOCYTES UA: NEGATIVE
Nitrite: NEGATIVE
PROTEIN: NEGATIVE mg/dL
Specific Gravity, Urine: 1.012 (ref 1.005–1.030)
pH: 6 (ref 5.0–8.0)

## 2015-01-26 LAB — WET PREP, GENITAL
Sperm: NONE SEEN
Trich, Wet Prep: NONE SEEN
Yeast Wet Prep HPF POC: NONE SEEN

## 2015-01-26 LAB — RAPID HIV SCREEN (HIV 1/2 AB+AG)
HIV 1/2 Antibodies: NONREACTIVE
HIV-1 P24 Antigen - HIV24: NONREACTIVE

## 2015-01-26 MED ORDER — METRONIDAZOLE 500 MG PO TABS: 500.0000 mg | ORAL_TABLET | Freq: Two times a day (BID) | ORAL | Status: DC

## 2015-01-26 NOTE — ED Provider Notes (Signed)
CSN: [id number]     Arrival date & time 01/26/15  [id number] History   First MD Initiated Contact with Patient 01/26/15 [phone]     Chief Complaint  Patient presents with  . Vaginal Discharge     (Consider location/radiation/quality/duration/timing/severity/associated sxs/prior Treatment) HPI   G1P1001 pt has had 2 weeks of abnormal grey/brown vaginal discharge, 1 week of urinary frequency and sharp stabbing suprapubic pain.  Vaginal discharge seems similar to prior BV.  LMP some time last month, she is unsure of dates.  She did have unprotected sexual intercourse with a new partner last month as well.  Denies fevers, N/V, bowel changes.  Denies vaginal bleeding.  Pt is concerned about HIV, would like an HIV test that also tests for antigen, no just antibody.    Past Medical History  Diagnosis Date  . Iron deficiency anemia   . Collapsed lung   . Vitamin D deficiency    Past Surgical History  Procedure Laterality Date  . Chest tube insertion     No family history on file. Social History  Substance Use Topics  . Smoking status: Current Every Day Smoker -- 0.50 packs/day    Types: Cigarettes  . Smokeless tobacco: None  . Alcohol Use: Yes     Comment: socially   OB History    No data available     Review of Systems  Constitutional: Negative for fever and chills.  Gastrointestinal: Positive for abdominal pain. Negative for nausea, vomiting, diarrhea and constipation.  Genitourinary: Positive for frequency, vaginal discharge and pelvic pain. Negative for dysuria, vaginal bleeding and menstrual problem.  Musculoskeletal: Positive for back pain.  Skin: Negative for rash.  Allergic/Immunologic: Negative for immunocompromised state.  Hematological: Does not bruise/bleed easily.      Allergies  Penicillins  Home Medications   Prior to Admission medications   Medication Sig Start Date End Date Taking? Authorizing Provider  azithromycin (ZITHROMAX Z-PAK) 250 MG tablet Take 1  tablet (250 mg total) by mouth daily. 500mg  PO day 1, then 250mg  PO days 205 Patient not taking: Reported on 06/11/2014 02/21/14   Alvina Chou, PA-C  clindamycin (CLEOCIN) 150 MG capsule Take 2 capsules (300 mg total) by mouth 3 (three) times daily. May dispense as 150mg  capsules Patient not taking: Reported on 06/11/2014 07/10/13   Baron Sane, PA-C  Cyanocobalamin (VITAMIN B-12 PO) Take 1 tablet by mouth every evening.    Historical Provider, MD  dextromethorphan (DELSYM) 30 MG/5ML liquid Take 5 mLs (30 mg total) by mouth as needed for cough. Patient not taking: Reported on 06/11/2014 02/21/14   Alvina Chou, PA-C  diphenhydrAMINE (BENADRYL) 25 MG tablet Take 50 mg by mouth every 6 (six) hours as needed for allergies.     Historical Provider, MD  fluconazole (DIFLUCAN) 150 MG tablet Take 1 tablet (150 mg total) by mouth once. 06/11/14   Britt Bottom, NP  ibuprofen (ADVIL,MOTRIN) 200 MG tablet Take 600 mg by mouth every 6 (six) hours as needed for moderate pain.     Historical Provider, MD  IRON PO Take 1 tablet by mouth daily as needed. For iron    Historical Provider, MD  Lactobacillus (ACIDOPHILUS PROBIOTIC) 100 MG CAPS Take 1 capsule (100 mg total) by mouth daily. Patient not taking: Reported on 06/11/2014 07/10/13   Baron Sane, PA-C  lidocaine (XYLOCAINE) 2 % solution Use as directed 20 mLs in the mouth or throat as needed for mouth pain. Patient not taking: Reported on 06/11/2014 07/10/13   Baron Sane,  PA-C  metroNIDAZOLE (FLAGYL) 500 MG tablet Take 1 tablet (500 mg total) by mouth 2 (two) times daily. Patient not taking: Reported on 06/11/2014 07/10/13   Baron Sane, PA-C  Multiple Vitamin (MULTIVITAMIN WITH MINERALS) TABS tablet Take 1 tablet by mouth daily.    Historical Provider, MD  naproxen (NAPROSYN) 500 MG tablet Take 1 tablet (500 mg total) by mouth 2 (two) times daily with a meal. Patient not taking: Reported on 06/11/2014 07/10/13   Anderson [place]  Piepenbrink, PA-C   BP 124/69 mmHg  Pulse 101  Temp(Src) 98.5 F (36.9 C) (Oral)  Resp 18  SpO2 100%  LMP 01/15/2015 (Approximate) Physical Exam  Constitutional: She appears well-developed and well-nourished. No distress.  HENT:  Head: Normocephalic and atraumatic.  Eyes: Conjunctivae are normal.  Neck: Normal range of motion. Neck supple.  Cardiovascular: Normal rate and regular rhythm.   Pulmonary/Chest: Effort normal and breath sounds normal. No respiratory distress. She has no wheezes. She has no rales.  Abdominal: Soft. She exhibits no distension and no mass. There is no tenderness. There is no rebound, no guarding and no CVA tenderness.  Genitourinary:  String with small attached yellow bead coming through cervical os.   Small amount of thin discharge.  No tenderness. No CMT.  No tenderness, fullness, or masses on bimanual exam.    Musculoskeletal: She exhibits no edema.  Neurological: She is alert. She exhibits normal muscle tone.  Skin: She is not diaphoretic.  Nursing note and vitals reviewed.   ED Course  Procedures (including critical care time) Labs Review Labs Reviewed  WET PREP, GENITAL - Abnormal; Notable for the following:    Clue Cells Wet Prep HPF POC PRESENT (*)    WBC, Wet Prep HPF POC MANY (*)    All other components within normal limits  URINE CULTURE  RAPID HIV SCREEN (HIV 1/2 AB+AG)  URINALYSIS, ROUTINE W REFLEX MICROSCOPIC (NOT AT [facility])  POC URINE PREG, ED  GC/CHLAMYDIA PROBE AMP ([employer]) NOT AT [facility]    Imaging Review No results found. I have personally reviewed and evaluated these images and lab results as part of my medical decision-making.   EKG Interpretation None      MDM   Final diagnoses:  BV (bacterial vaginosis)    Afebrile, nontoxic patient with abnormal vaginal discharge, recent unprotected sexual intercourse with new partner.  Wet prep shows e/o BV.  Rapid HIV is negative.  Pregnancy test negative.  UA shows no signs  of infection -  Culture sent as pt is having urinary frequency.  Pt advised to retest for both pregnancy and HIV.  No abdominal or CVA tenderness.  D/C home with flagyl, gyn follow up.  Discussed result, findings, treatment, and follow up  with patient.  Pt given return precautions.  Pt verbalizes understanding and agrees with plan.         Clayton Bibles, PA-C 01/26/15 1254  Julianne Rice, MD 02/01/15 [phone]

## 2015-01-26 NOTE — Discharge Instructions (Signed)
Read the information below.  Use the prescribed medication as directed.  Please discuss all new medications with your pharmacist.  You may return to the Emergency Department at any time for worsening condition or any new symptoms that concern you.    If you develop high fevers, abdominal pain, worsening urinary symptoms, uncontrolled vomiting, or are unable to tolerate fluids by mouth, return to the ER for a recheck.     Bacterial Vaginosis Bacterial vaginosis is a vaginal infection that occurs when the normal balance of bacteria in the vagina is disrupted. It results from an overgrowth of certain bacteria. This is the most common vaginal infection in women of childbearing age. Treatment is important to prevent complications, especially in pregnant women, as it can cause a premature delivery. CAUSES  Bacterial vaginosis is caused by an increase in harmful bacteria that are normally present in smaller amounts in the vagina. Several different kinds of bacteria can cause bacterial vaginosis. However, the reason that the condition develops is not fully understood. RISK FACTORS Certain activities or behaviors can put you at an increased risk of developing bacterial vaginosis, including:  Having a new sex partner or multiple sex partners.  Douching.  Using an intrauterine device (IUD) for contraception. Women do not get bacterial vaginosis from toilet seats, bedding, swimming pools, or contact with objects around them. SIGNS AND SYMPTOMS  Some women with bacterial vaginosis have no signs or symptoms. Common symptoms include:  Grey vaginal discharge.  A fishlike odor with discharge, especially after sexual intercourse.  Itching or burning of the vagina and vulva.  Burning or pain with urination. DIAGNOSIS  Your health care provider will take a medical history and examine the vagina for signs of bacterial vaginosis. A sample of vaginal fluid may be taken. Your health care provider will look at  this sample under a microscope to check for bacteria and abnormal cells. A vaginal pH test may also be done.  TREATMENT  Bacterial vaginosis may be treated with antibiotic medicines. These may be given in the form of a pill or a vaginal cream. A second round of antibiotics may be prescribed if the condition comes back after treatment. Because bacterial vaginosis increases your risk for sexually transmitted diseases, getting treated can help reduce your risk for chlamydia, gonorrhea, HIV, and herpes. HOME CARE INSTRUCTIONS   Only take over-the-counter or prescription medicines as directed by your health care provider.  If antibiotic medicine was prescribed, take it as directed. Make sure you finish it even if you start to feel better.  Tell all sexual partners that you have a vaginal infection. They should see their health care provider and be treated if they have problems, such as a mild rash or itching.  During treatment, it is important that you follow these instructions:  Avoid sexual activity or use condoms correctly.  Do not douche.  Avoid alcohol as directed by your health care provider.  Avoid breastfeeding as directed by your health care provider. SEEK MEDICAL CARE IF:   Your symptoms are not improving after 3 days of treatment.  You have increased discharge or pain.  You have a fever. MAKE SURE YOU:   Understand these instructions.  Will watch your condition.  Will get help right away if you are not doing well or get worse. FOR MORE INFORMATION  Centers for Disease Control and Prevention, Division of STD Prevention: AppraiserFraud.fi American Sexual Health Association (ASHA): www.ashastd.org    This information is not intended to replace advice given  to you by your health care provider. Make sure you discuss any questions you have with your health care provider.   Document Released: 02/03/2005 Document Revised: 02/24/2014 Document Reviewed: 09/15/2012 Elsevier  Interactive Patient Education 2016 [employer].   Emergency Department Resource Guide 1) Find a Doctor and Pay Out of Pocket Although you won't have to find out who is covered by your insurance plan, it is a good idea to ask around and get recommendations. You will then need to call the office and see if the doctor you have chosen will accept you as a new patient and what types of options they offer for patients who are self-pay. Some doctors offer discounts or will set up payment plans for their patients who do not have insurance, but you will need to ask so you aren't surprised when you get to your appointment.  2) Contact Your Local Health Department Not all health departments have doctors that can see patients for sick visits, but many do, so it is worth a call to see if yours does. If you don't know where your local health department is, you can check in your phone book. The CDC also has a tool to help you locate your state's health department, and many state websites also have listings of all of their local health departments.  3) Find a [facility] If your illness is not likely to be very severe or complicated, you may want to try a walk in clinic. These are popping up all over the country in pharmacies, drugstores, and shopping centers. They're usually staffed by nurse practitioners or physician assistants that have been trained to treat common illnesses and complaints. They're usually fairly quick and inexpensive. However, if you have serious medical issues or chronic medical problems, these are probably not your best option.  No Primary Care Doctor: - Call Health Connect at  [phone] - they can help you locate a primary care doctor that  accepts your insurance, provides certain services, etc. - Physician Referral Service- [phone]  Chronic Pain Problems: Organization         Address  Phone   Notes  [facility]  [phone] Patients need to be referred by  their primary care doctor.   Medication Assistance: Organization         Address  Phone   Notes  [facility] Medication [facility] [place] [place], [place]ia [place] [address] [phone] --Must be a resident of [facility] -- Must have NO insurance coverage whatsoever (no Medicaid/ Medicare, etc.) -- The pt. MUST have a primary care doctor that directs their care regularly and follows them in the community   [facility]ist  [phone]   [employer]  [phone]    Agencies that provide inexpensive medical care: Organization         Address  Phone   Notes  [place]  [phone]   [facility]    [phone]   [facility] [place], [place] [address] [phone]   [place] [address] [place] [phone]   Planned Parenthood    [phone]   [facility]    [phone]   [place] and [place] [address], [place] Phone:  [phone], Fax:  [phone] Hours of Operation:  9 am - 6 pm, M-F.  Also accepts Medicaid/Medicare and self-pay.  [facility] for [place] [address], [address], [place] Phone: [phone], Fax: [phone]. Hours of Operation:  8:30 am - 5:30 pm, M-F.  Also accepts Medicaid and self-pay.  [facility] [place] [address], [place] Phone: [phone]   [place], [place], [place] [phone], Ext. [phone] Mondays & Thursdays: 7-9 AM.  First 15 patients are seen on a first come, first serve basis.    [place] Providers:  Organization         Address  Phone   Notes  [facility] [address], [place] [phone] Also accepts self-pay patients.  [employer] [id number] [place], [place]  [phone]   [place], [address], [place] [phone]   [facility] [address], [place] [phone]   Lucianne Lei [address], [place]   [phone] Only accepts [place] Access [place] patients after they have their name applied to their card.   Self-Pay (no insurance) in [facility]:  Organization         Address  Phone   Notes  Sickle Cell Patients, [facility] Internal Medicine [place] [phone]   [facility] [place] [phone]   [facility] [place]  [place], [place], [place] [phone]   [facility]/Dr. Osei-Bonsu  [address], [place] or [address], [place] [phone] Phone number for both [place] and [place] locations is the same.  [facility]gent [facility] and [facility] [address] [place] [phone]   [facility] [address] [place] or [address] [phone]   [facility] [address] [place] [phone], phone; [phone], fax Sees patients 1st and 3rd Saturday of every month.  Must not qualify for public or private insurance (i.e. Medicaid, Medicare, [place] Health Choice, Veterans' Benefits)  Household income should be no more than 200% of the poverty level The clinic cannot treat you if you are pregnant or think you are pregnant  Sexually transmitted diseases are not treated at the clinic.    Dental Care: Organization         Address  Phone  Notes  [facility] Department of [facility] [phone] Accepts children up to age 64 who are enrolled in [place] or [place]; pregnant women with a Medicaid card; and children who have applied for Medicaid or [place] Health Choice, but were declined, whose parents can pay a reduced  fee at time of service.  [facility] Department of [facility]  [address], [place] [phone] Accepts children up to age 48 who are enrolled in [place] or [place]; pregnant women with a Medicaid card; and children who have applied for Medicaid or [place] Health Choice, but were declined, whose parents can pay a reduced fee at time of service.  [facility] Access PROGRAM  [place] [phone] Patients are seen by appointment only. Walk-ins are not accepted. [place] will see patients 38 years of age and older. Monday - Tuesday (8am-5pm) Most Wednesdays (8:30-5pm) $30 per  visit, cash only  [facility] Adult [employer] PROGRAM  [address], [facility] [phone] Patients are seen by appointment only. Walk-ins are not accepted. Mutual will see patients 52 years of age and older. One Wednesday Evening (Monthly: Volunteer Based).  $30 per visit, cash only  [place]  [phone] for adults; Children under age 107, call Graduate Pediatric Dentistry at [phone]. Children aged 4-14, please call [phone] to request a pediatric application.  Dental services are provided in all areas of dental care including fillings, crowns and bridges, complete and partial dentures, implants, gum treatment, root canals, and extractions. Preventive care is also provided. Treatment is provided to both adults and children. Patients are selected via a lottery and there is often a waiting list.   [facility] [address] [place]  [phone] [email or web address]   [facility] [address] [place], [place] [phone], Ext. [phone] Second and Fourth Thursday of each month, opens at 6:30 AM; Clinic ends at 9 AM.  Patients are seen on a first-come first-served basis, and a limited number are seen during each clinic.   [facility]  [address] Hillard Danker [place], [place] [phone]   Eligibility Requirements You must have lived in [place], [place], or [place] counties for at least the last three months.   You cannot be eligible for state or federal sponsored [employer], including [employer], [place], or [employer].   You generally cannot be eligible for healthcare insurance through your employer.    How to apply: Eligibility screenings are held every Tuesday and Wednesday afternoon from 1:00 pm until 4:00 pm. You do not need an appointment for the interview!  [facility] [address] [place], [place]   [place]  [place] Department  [place]  [phone]    Behavioral Health Resources in the Community: Intensive Outpatient Programs Organization         Address  Phone  Notes  [place]. [address], [place], [place] [phone]   [facility] Outpatient [address] [place], [place]   ADS: Alcohol & Drug Svcs [address] [place], [place]   [place] [address]  [place], [place] or [phone]   Substance Abuse Resources Organization         Address  Phone  Notes  Alcohol and Drug Services  [phone]   [place]  [phone]   The [place]   Chinita Pester  [phone]   Residential & Outpatient Substance Abuse Program  [phone]   Psychological Services Organization         Address  Phone  Notes  [facility] [place]  [place]  [phone]   [place] [address], [place] or [phone]    Mobile Crisis Teams Organization         Address  Phone  Notes  [facility], Mobile Crisis Care Unit  [phone]   Assertive Psychotherapeutic  Services  [address] [place], [place]   [place] Levels [address] [place] [phone]    Self-Help/Support Groups Organization         Address  Phone  Notes  Mental Health Assoc. of [place] - variety of support groups  [place] Call for more information  Narcotics Anonymous (NA), Caring Services [address], [employer] [place]  2 meetings at this location   Special educational needs teacher         Address  Phone  Notes  [facility] Residential Treatment [place],    [place]  [phone]   [facility]  [address], [place] [address]211, [place], [place]   [place], [place] [phone] Admissions: 8am-3pm M-F  Incentives Substance [place] [address],    [place], [place] [phone]   [facility] [address] [place], [place], [place]   [facility] [address]  [place], [place]   [facility] Programs - Intensive Outpatient [address] Kristeen [address], [place], [place]   [facility] ([place].,  [place], [place] [phone] or [phone]   [facility] (RTS) [address] [place], [place] Accepts Medicaid  [facility] [place] [address]  [place] [phone] Substance Abuse/Addiction Treatment   Health Central Organization         Address  Phone  Notes  [facility]  [phone]   Domenic Schwab, PhD [address] Arlis Porta [place], [place]   [phone] or [phone]   [place], [place] [phone]   [facility] [phone] [place], [place] [phone] Insurance/Medicaid/sponsorship through [facility] and Families [address], [address]                                    [place], [place] [phone] [place] [address], [place] [phone]    Dr. Adele Schilder  [phone]   [facility] Dept. 1) [address] [place] 2) [place] 3)  [place] [address], [place] [phone]  [phone]   [place] [phone] or [phone] (After Hours)

## 2015-01-26 NOTE — ED Notes (Signed)
Pt states had BV 2-3 months ago, states feels like she has the same-- is having grayish brownish drainage.

## 2015-01-26 NOTE — ED Notes (Signed)
Patient undressed, in gown, on continuous pulse oximetry and blood pressure cuff; warm blanket given 

## 2015-01-29 LAB — URINE CULTURE

## 2015-01-29 LAB — GC/CHLAMYDIA PROBE AMP (~~LOC~~) NOT AT ARMC
CHLAMYDIA, DNA PROBE: NEGATIVE
Neisseria Gonorrhea: NEGATIVE

## 2015-02-01 ENCOUNTER — Emergency Department (HOSPITAL_COMMUNITY)
Admission: EM | Admit: 2015-02-01 | Discharge: 2015-02-01 | Disposition: A | Discharge status: Home / Self Care | Payer: Medicaid Other | Attending: Emergency Medicine | Admitting: Emergency Medicine

## 2015-02-01 ENCOUNTER — Encounter (HOSPITAL_COMMUNITY): Payer: Self-pay

## 2015-02-01 DIAGNOSIS — Z792 Long term (current) use of antibiotics: Secondary | ICD-10-CM | POA: Diagnosis not present

## 2015-02-01 DIAGNOSIS — J069 Acute upper respiratory infection, unspecified: Secondary | ICD-10-CM | POA: Insufficient documentation

## 2015-02-01 DIAGNOSIS — Z862 Personal history of diseases of the blood and blood-forming organs and certain disorders involving the immune mechanism: Secondary | ICD-10-CM | POA: Diagnosis not present

## 2015-02-01 DIAGNOSIS — Z880 Allergy status to penicillin: Secondary | ICD-10-CM | POA: Diagnosis not present

## 2015-02-01 DIAGNOSIS — F17210 Nicotine dependence, cigarettes, uncomplicated: Secondary | ICD-10-CM | POA: Insufficient documentation

## 2015-02-01 DIAGNOSIS — R05 Cough: Secondary | ICD-10-CM | POA: Diagnosis present

## 2015-02-01 MED ORDER — GUAIFENESIN 100 MG/5ML PO LIQD: 100.0000 mg | ORAL | Status: DC | PRN

## 2015-02-01 NOTE — ED Notes (Signed)
Pt and daughter both here for same.  Congestion, headache, itchy throat, no fever, chills

## 2015-02-01 NOTE — ED Provider Notes (Signed)
CSN: [id number]     Arrival date & time 02/01/15  [id number] History   First MD Initiated Contact with Patient 02/01/15 0902     Chief Complaint  Patient presents with  . Cough  . Nasal Congestion     (Consider location/radiation/quality/duration/timing/severity/associated sxs/prior Treatment) HPI Comments: Pt comes in with c/o cough, congestion, headache, sore throat and chills. Symptoms have been going on for the last 2 weeks. Pt hasn't taken anything for the symptoms. No fever. No n/v/d. Nothing makes the symptoms better or worse  The history is provided by the patient. No language interpreter was used.    Past Medical History  Diagnosis Date  . Iron deficiency anemia   . Collapsed lung   . Vitamin D deficiency    Past Surgical History  Procedure Laterality Date  . Chest tube insertion     History reviewed. No pertinent family history. Social History  Substance Use Topics  . Smoking status: Current Every Day Smoker -- 0.50 packs/day    Types: Cigarettes  . Smokeless tobacco: None  . Alcohol Use: Yes     Comment: socially   OB History    No data available     Review of Systems  All other systems reviewed and are negative.     Allergies  Penicillins  Home Medications   Prior to Admission medications   Medication Sig Start Date End Date Taking? Authorizing Provider  ibuprofen (ADVIL,MOTRIN) 200 MG tablet Take 600 mg by mouth every 6 (six) hours as needed for moderate pain.     Historical Provider, MD  metroNIDAZOLE (FLAGYL) 500 MG tablet Take 1 tablet (500 mg total) by mouth 2 (two) times daily. 01/26/15   Clayton Bibles, PA-C   BP 139/77 mmHg  Pulse 92  Temp(Src) 98.4 F (36.9 C) (Oral)  Resp 18  SpO2 100%  LMP 01/15/2015 (Approximate) Physical Exam  Constitutional: She is oriented to person, place, and time. She appears well-developed and well-nourished.  HENT:  Right Ear: External ear normal.  Left Ear: External ear normal.  Nose: Rhinorrhea present.   Mouth/Throat: Posterior oropharyngeal erythema present.  Eyes: Conjunctivae and EOM are normal. Pupils are equal, round, and reactive to light.  Cardiovascular: Normal rate and regular rhythm.   Pulmonary/Chest: Effort normal and breath sounds normal.  Abdominal: Soft. Bowel sounds are normal. There is no tenderness.  Musculoskeletal: Normal range of motion.  Neurological: She is alert and oriented to person, place, and time. Coordination normal.  Skin: Skin is warm and dry.  Psychiatric: She has a normal mood and affect.  Nursing note and vitals reviewed.   ED Course  Procedures (including critical care time) Labs Review Labs Reviewed  HIV ANTIBODY (ROUTINE TESTING)    Imaging Review No results found. I have personally reviewed and evaluated these images and lab results as part of my medical decision-making.   EKG Interpretation None      MDM   Final diagnoses:  URI (upper respiratory infection)   Likely viral in nature. Pt is non toxic in appearance. Pt requesting hiv testing. It was sent    Glendell Docker, NP 02/01/15 0935  Lacretia Leigh, MD 02/05/15 2253

## 2015-02-01 NOTE — Discharge Instructions (Signed)
Upper Respiratory Infection, Adult Most upper respiratory infections (URIs) are a viral infection of the air passages leading to the lungs. A URI affects the nose, throat, and upper air passages. The most common type of URI is nasopharyngitis and is typically referred to as "the common cold." URIs run their course and usually go away on their own. Most of the time, a URI does not require medical attention, but sometimes a bacterial infection in the upper airways can follow a viral infection. This is called a secondary infection. Sinus and middle ear infections are common types of secondary upper respiratory infections. Bacterial pneumonia can also complicate a URI. A URI can worsen asthma and chronic obstructive pulmonary disease (COPD). Sometimes, these complications can require emergency medical care and may be life threatening.  CAUSES Almost all URIs are caused by viruses. A virus is a type of germ and can spread from one person to another.  RISKS FACTORS You may be at risk for a URI if:   You smoke.   You have chronic heart or lung disease.  You have a weakened defense (immune) system.   You are very young or very old.   You have nasal allergies or asthma.  You work in crowded or poorly ventilated areas.  You work in health care facilities or schools. SIGNS AND SYMPTOMS  Symptoms typically develop 2-3 days after you come in contact with a cold virus. Most viral URIs last 7-10 days. However, viral URIs from the influenza virus (flu virus) can last 14-18 days and are typically more severe. Symptoms may include:   Runny or stuffy (congested) nose.   Sneezing.   Cough.   Sore throat.   Headache.   Fatigue.   Fever.   Loss of appetite.   Pain in your forehead, behind your eyes, and over your cheekbones (sinus pain).  Muscle aches.  DIAGNOSIS  Your health care provider may diagnose a URI by:  Physical exam.  Tests to check that your symptoms are not due to  another condition such as:  Strep throat.  Sinusitis.  Pneumonia.  Asthma. TREATMENT  A URI goes away on its own with time. It cannot be cured with medicines, but medicines may be prescribed or recommended to relieve symptoms. Medicines may help:  Reduce your fever.  Reduce your cough.  Relieve nasal congestion. HOME CARE INSTRUCTIONS   Take medicines only as directed by your health care provider.   Gargle warm saltwater or take cough drops to comfort your throat as directed by your health care provider.  Use a warm mist humidifier or inhale steam from a shower to increase air moisture. This may make it easier to breathe.  Drink enough fluid to keep your urine clear or pale yellow.   Eat soups and other clear broths and maintain good nutrition.   Rest as needed.   Return to work when your temperature has returned to normal or as your health care provider advises. You may need to stay home longer to avoid infecting others. You can also use a face mask and careful hand washing to prevent spread of the virus.  Increase the usage of your inhaler if you have asthma.   Do not use any tobacco products, including cigarettes, chewing tobacco, or electronic cigarettes. If you need help quitting, ask your health care provider. PREVENTION  The best way to protect yourself from getting a cold is to practice good hygiene.   Avoid oral or hand contact with people with cold   symptoms.   Wash your hands often if contact occurs.  There is no clear evidence that vitamin C, vitamin E, echinacea, or exercise reduces the chance of developing a cold. However, it is always recommended to get plenty of rest, exercise, and practice good nutrition.  SEEK MEDICAL CARE IF:   You are getting worse rather than better.   Your symptoms are not controlled by medicine.   You have chills.  You have worsening shortness of breath.  You have brown or red mucus.  You have yellow or brown nasal  discharge.  You have pain in your face, especially when you bend forward.  You have a fever.  You have swollen neck glands.  You have pain while swallowing.  You have white areas in the back of your throat. SEEK IMMEDIATE MEDICAL CARE IF:   You have severe or persistent:  Headache.  Ear pain.  Sinus pain.  Chest pain.  You have chronic lung disease and any of the following:  Wheezing.  Prolonged cough.  Coughing up blood.  A change in your usual mucus.  You have a stiff neck.  You have changes in your:  Vision.  Hearing.  Thinking.  Mood. MAKE SURE YOU:   Understand these instructions.  Will watch your condition.  Will get help right away if you are not doing well or get worse.   This information is not intended to replace advice given to you by your health care provider. Make sure you discuss any questions you have with your health care provider.   Document Released: 07/30/2000 Document Revised: 06/20/2014 Document Reviewed: 05/11/2013 Elsevier Interactive Patient Education 2016 Elsevier Inc.  

## 2015-02-01 NOTE — ED Notes (Addendum)
Patient comes from home with c/o productive cough, sore throat, nasal congestion, maxillary sinus pain and 1 episode of diarrhea x2 weeks.  Patient denies fever, N/V.    General: Awake, alert, well-nourished, NAD HEENT: Normal    Neck: No masses palpated. Submandibular lymphadenopathy noted    Eyes: Sclera white with no injection.  No drainage noted.    Nose: nasal turbinates moist, rhinorrhea noted    Throat: MMM, mild erythema to oropharynx Cardio: RRR, S1S2 heard, no murmurs appreciated Pulm: CTAB, no wheezes, rhonchi or rales Skin: dry, intact, no rashes or lesions Neuro: Strength and sensation grossly intact

## 2015-02-02 LAB — HIV ANTIBODY (ROUTINE TESTING W REFLEX): HIV Screen 4th Generation wRfx: NONREACTIVE

## 2015-07-29 ENCOUNTER — Encounter (HOSPITAL_COMMUNITY): Payer: Self-pay | Admitting: Emergency Medicine

## 2015-07-29 ENCOUNTER — Emergency Department (HOSPITAL_COMMUNITY)
Admission: EM | Admit: 2015-07-29 | Discharge: 2015-07-29 | Disposition: A | Discharge status: Home / Self Care | Payer: Medicaid Other | Attending: Emergency Medicine | Admitting: Emergency Medicine

## 2015-07-29 DIAGNOSIS — N760 Acute vaginitis: Secondary | ICD-10-CM | POA: Insufficient documentation

## 2015-07-29 DIAGNOSIS — F17210 Nicotine dependence, cigarettes, uncomplicated: Secondary | ICD-10-CM | POA: Insufficient documentation

## 2015-07-29 DIAGNOSIS — N390 Urinary tract infection, site not specified: Secondary | ICD-10-CM | POA: Insufficient documentation

## 2015-07-29 DIAGNOSIS — R1030 Lower abdominal pain, unspecified: Secondary | ICD-10-CM | POA: Diagnosis present

## 2015-07-29 DIAGNOSIS — B9689 Other specified bacterial agents as the cause of diseases classified elsewhere: Secondary | ICD-10-CM | POA: Insufficient documentation

## 2015-07-29 LAB — COMPREHENSIVE METABOLIC PANEL
ALT: 17 U/L (ref 14–54)
ANION GAP: 5 (ref 5–15)
AST: 23 U/L (ref 15–41)
Albumin: 4.5 g/dL (ref 3.5–5.0)
Alkaline Phosphatase: 33 U/L — ABNORMAL LOW (ref 38–126)
BUN: 8 mg/dL (ref 6–20)
CO2: 24 mmol/L (ref 22–32)
CREATININE: 0.91 mg/dL (ref 0.44–1.00)
Calcium: 9.2 mg/dL (ref 8.9–10.3)
Chloride: 109 mmol/L (ref 101–111)
Glucose, Bld: 96 mg/dL (ref 65–99)
POTASSIUM: 3.4 mmol/L — AB (ref 3.5–5.1)
SODIUM: 138 mmol/L (ref 135–145)
Total Bilirubin: 0.6 mg/dL (ref 0.3–1.2)
Total Protein: 7.8 g/dL (ref 6.5–8.1)

## 2015-07-29 LAB — CBC
HEMATOCRIT: 31.6 % — AB (ref 36.0–46.0)
HEMOGLOBIN: 10.5 g/dL — AB (ref 12.0–15.0)
MCH: 22.5 pg — AB (ref 26.0–34.0)
MCHC: 33.2 g/dL (ref 30.0–36.0)
MCV: 67.7 fL — ABNORMAL LOW (ref 78.0–100.0)
PLATELETS: 287 10*3/uL (ref 150–400)
RBC: 4.67 MIL/uL (ref 3.87–5.11)
RDW: 18.2 % — ABNORMAL HIGH (ref 11.5–15.5)
WBC: 5 10*3/uL (ref 4.0–10.5)

## 2015-07-29 LAB — URINALYSIS, ROUTINE W REFLEX MICROSCOPIC
Bilirubin Urine: NEGATIVE
GLUCOSE, UA: NEGATIVE mg/dL
Ketones, ur: NEGATIVE mg/dL
Leukocytes, UA: NEGATIVE
Nitrite: NEGATIVE
PH: 5 (ref 5.0–8.0)
PROTEIN: NEGATIVE mg/dL
SPECIFIC GRAVITY, URINE: 1.042 — AB (ref 1.005–1.030)

## 2015-07-29 LAB — URINE MICROSCOPIC-ADD ON

## 2015-07-29 LAB — WET PREP, GENITAL
Sperm: NONE SEEN
Trich, Wet Prep: NONE SEEN
Yeast Wet Prep HPF POC: NONE SEEN

## 2015-07-29 LAB — I-STAT BETA HCG BLOOD, ED (MC, WL, AP ONLY): I-stat hCG, quantitative: <5 m[IU]/mL (ref <5)

## 2015-07-29 LAB — LIPASE, BLOOD: LIPASE: 30 U/L (ref 11–51)

## 2015-07-29 MED ORDER — CEPHALEXIN 500 MG PO CAPS
500.0000 mg | ORAL_CAPSULE | Freq: Once | ORAL | Status: AC
  Administered 2015-07-29: 500 mg via ORAL
  Filled 2015-07-29: qty 1

## 2015-07-29 MED ORDER — CEFTRIAXONE SODIUM 250 MG IJ SOLR: 250.0000 mg | Freq: Once | INTRAMUSCULAR | Status: DC

## 2015-07-29 MED ORDER — AZITHROMYCIN 250 MG PO TABS
1000.0000 mg | ORAL_TABLET | Freq: Once | ORAL | Status: DC
Start: 2015-07-29 — End: 2015-07-29

## 2015-07-29 MED ORDER — CEPHALEXIN 500 MG PO CAPS: 500.0000 mg | ORAL_CAPSULE | Freq: Two times a day (BID) | ORAL | Status: DC

## 2015-07-29 MED ORDER — METRONIDAZOLE 500 MG PO TABS: 500.0000 mg | ORAL_TABLET | Freq: Two times a day (BID) | ORAL | Status: DC

## 2015-07-29 MED ORDER — METRONIDAZOLE 500 MG PO TABS
500.0000 mg | ORAL_TABLET | Freq: Once | ORAL | Status: AC
  Administered 2015-07-29: 500 mg via ORAL
  Filled 2015-07-29: qty 1

## 2015-07-29 NOTE — ED Provider Notes (Signed)
CSN: [id number]     Arrival date & time 07/29/15  1531 History   First MD Initiated Contact with Patient 07/29/15 1733     Chief Complaint  Patient presents with  . Abdominal Pain  . Exposure to STD    potential      (Consider location/radiation/quality/duration/timing/severity/associated sxs/prior Treatment) HPI   Blood pressure 128/75, pulse 89, temperature 99 F (37.2 C), temperature source Oral, resp. rate 16, height 5\' 5"  (1.651 m), weight 65.318 kg.  Frances Pearson is a 30 y.o. female complaining of lower abdominal pain which she describes as ovarian pain onset 1 week ago rated at 2 out of 80, no pain medication taken prior to arrival, foul-smelling vagina states it sounds like a UTI and an STD in a yeast infection all rolled up and one. Patient also has a lump on her left breast, No family history of breast cancer, no nipple discharge, unintentional weight loss, easy bruising or bleeding. States she feels dizzy and has flulike symptoms of lightheadedness, headaches, muscle aches states that she feels a pulling sensation in her abdomen states she may be pregnant, states she maybe menstruating. Patient denies fever, chills, nausea, vomiting, dysuria, hematuria endorses mild constipation on review of systems  Past Medical History  Diagnosis Date  . Iron deficiency anemia   . Collapsed lung   . Vitamin D deficiency    Past Surgical History  Procedure Laterality Date  . Chest tube insertion     No family history on file. Social History  Substance Use Topics  . Smoking status: Current Every Day Smoker -- 0.50 packs/day    Types: Cigarettes  . Smokeless tobacco: None  . Alcohol Use: Yes     Comment: socially   OB History    No data available     Review of Systems  10 systems reviewed and found to be negative, except as noted in the HPI.   Allergies  Penicillins  Home Medications   Prior to Admission medications   Medication Sig Start Date End Date Taking? Authorizing  Provider  Multiple Vitamin (MULTIVITAMIN WITH MINERALS) TABS tablet Take 1 tablet by mouth daily.   Yes Historical Provider, MD  cephALEXin (KEFLEX) 500 MG capsule Take 1 capsule (500 mg total) by mouth 2 (two) times daily. 07/29/15   Algenis Ballin, PA-C  guaiFENesin (ROBITUSSIN) 100 MG/5ML liquid Take 5-10 mLs (100-200 mg total) by mouth every 4 (four) hours as needed for cough. Patient not taking: Reported on 07/29/2015 02/01/15   Glendell Docker, NP  metroNIDAZOLE (FLAGYL) 500 MG tablet Take 1 tablet (500 mg total) by mouth 2 (two) times daily. One tab PO bid x 10 days 07/29/15   Elmyra Ricks Glynn Yepes, PA-C   BP 132/73 mmHg  Pulse 71  Temp(Src) 98.1 F (36.7 C) (Oral)  Resp 15  Ht 5\' 5"  (1.651 m)  Wt 65.318 kg  BMI 23.96 kg/m2  SpO2 100% Physical Exam  Constitutional: She is oriented to person, place, and time. She appears well-developed and well-nourished. No distress.  HENT:  Head: Normocephalic.  Eyes: Conjunctivae and EOM are normal.  Cardiovascular: Normal rate, regular rhythm and intact distal pulses.   Pulmonary/Chest: Effort normal and breath sounds normal. No stridor. No respiratory distress. She has no wheezes. She has no rales. She exhibits no tenderness.  Abdominal: Soft. Bowel sounds are normal. She exhibits no distension and no mass. There is no tenderness. There is no rebound and no guarding.  Genitourinary:  Pelvic Examination chaperoned by technician: No rashes  or lesions, patient has scant blood pooled in the posterior fornix, no cervical motion or adnexal tenderness.   Breast exam with no masses appreciated.  Musculoskeletal: Normal range of motion.  Neurological: She is alert and oriented to person, place, and time.  Psychiatric: She has a normal mood and affect.  Nursing note and vitals reviewed.   ED Course  Procedures (including critical care time) Labs Review Labs Reviewed  WET PREP, GENITAL - Abnormal; Notable for the following:    Clue Cells Wet Prep  HPF POC PRESENT (*)    WBC, Wet Prep HPF POC MANY (*)    All other components within normal limits  COMPREHENSIVE METABOLIC PANEL - Abnormal; Notable for the following:    Potassium 3.4 (*)    Alkaline Phosphatase 33 (*)    All other components within normal limits  CBC - Abnormal; Notable for the following:    Hemoglobin 10.5 (*)    HCT 31.6 (*)    MCV 67.7 (*)    MCH 22.5 (*)    RDW 18.2 (*)    All other components within normal limits  URINALYSIS, ROUTINE W REFLEX MICROSCOPIC (NOT AT [facility]) - Abnormal; Notable for the following:    Color, Urine AMBER (*)    APPearance CLOUDY (*)    Specific Gravity, Urine 1.042 (*)    Hgb urine dipstick SMALL (*)    All other components within normal limits  URINE MICROSCOPIC-ADD ON - Abnormal; Notable for the following:    Squamous Epithelial / LPF 0-5 (*)    Bacteria, UA MANY (*)    All other components within normal limits  LIPASE, BLOOD  RPR  HIV ANTIBODY (ROUTINE TESTING)  I-STAT BETA HCG BLOOD, ED ([facility], [facility], [facility] ONLY)  GC/CHLAMYDIA PROBE AMP ([place]) NOT AT [facility]    Imaging Review No results found. I have personally reviewed and evaluated these images and lab results as part of my medical decision-making.   EKG Interpretation None      MDM   Final diagnoses:  Bacterial vaginosis  UTI (lower urinary tract infection)   Filed Vitals:   07/29/15 1550 07/29/15 2003  BP: 128/75 132/73  Pulse: 89 71  Temp: 99 F (37.2 C) 98.1 F (36.7 C)  TempSrc: Oral Oral  Resp: 16 15  Height: 5\' 5"  (1.651 m)   Weight: 65.318 kg   SpO2:  100%    Medications  metroNIDAZOLE (FLAGYL) tablet 500 mg (500 mg Oral Given 07/29/15 2005)  cephALEXin (KEFLEX) capsule 500 mg (500 mg Oral Given 07/29/15 2005)    Frances Pearson is 30 y.o. female presenting with Complaints including vaginal discharge. Urine with many bacteria her urinary symptoms, pelvic exam is not consistent with a PID, she has many white blood cells bacterial vaginosis. HIV and  syphilis pending, patient counseled to refrain from sex until the full results.   On further discussion with this patient says that she has urinary frequency, we'll culture urine and treat with Keflex and Flagyl. Encourage close follow-up with primary care.  Evaluation does not show pathology that would require ongoing emergent intervention or inpatient treatment. Pt is hemodynamically stable and mentating appropriately. Discussed findings and plan with patient/guardian, who agrees with care plan. All questions answered. Return precautions discussed and outpatient follow up given.      Monico Blitz, PA-C 07/29/15 [id number]  Julianne Rice, MD 07/29/15 2322

## 2015-07-29 NOTE — ED Notes (Addendum)
Pt from home with many complaints. Pt states she has abdominal cramping, vaginal smell and itching, "shooting pains in her ovaries", intermittent dizziness, constipation, a lump on her left breast, and darkening of her areolas. Pt states that she had most of the symptoms the last time she was pregnant, so she thinks she may be pregnant. Pt states she has an IUD in place. Pt states these symptoms have been present x 4 days   Pt would also like to be checked for stds

## 2015-07-29 NOTE — Discharge Instructions (Signed)
Please follow with your primary care doctor in the next 2 days for a check-up. They must obtain records for further management.   Do not hesitate to return to the Emergency Department for any new, worsening or concerning symptoms.   Bacterial Vaginosis Bacterial vaginosis is a vaginal infection that occurs when the normal balance of bacteria in the vagina is disrupted. It results from an overgrowth of certain bacteria. This is the most common vaginal infection in women of childbearing age. Treatment is important to prevent complications, especially in pregnant women, as it can cause a premature delivery. CAUSES  Bacterial vaginosis is caused by an increase in harmful bacteria that are normally present in smaller amounts in the vagina. Several different kinds of bacteria can cause bacterial vaginosis. However, the reason that the condition develops is not fully understood. RISK FACTORS Certain activities or behaviors can put you at an increased risk of developing bacterial vaginosis, including:  Having a new sex partner or multiple sex partners.  Douching.  Using an intrauterine device (IUD) for contraception. Women do not get bacterial vaginosis from toilet seats, bedding, swimming pools, or contact with objects around them. SIGNS AND SYMPTOMS  Some women with bacterial vaginosis have no signs or symptoms. Common symptoms include:  Grey vaginal discharge.  A fishlike odor with discharge, especially after sexual intercourse.  Itching or burning of the vagina and vulva.  Burning or pain with urination. DIAGNOSIS  Your health care provider will take a medical history and examine the vagina for signs of bacterial vaginosis. A sample of vaginal fluid may be taken. Your health care provider will look at this sample under a microscope to check for bacteria and abnormal cells. A vaginal pH test may also be done.  TREATMENT  Bacterial vaginosis may be treated with antibiotic medicines. These may  be given in the form of a pill or a vaginal cream. A second round of antibiotics may be prescribed if the condition comes back after treatment. Because bacterial vaginosis increases your risk for sexually transmitted diseases, getting treated can help reduce your risk for chlamydia, gonorrhea, HIV, and herpes. HOME CARE INSTRUCTIONS   Only take over-the-counter or prescription medicines as directed by your health care provider.  If antibiotic medicine was prescribed, take it as directed. Make sure you finish it even if you start to feel better.  Tell all sexual partners that you have a vaginal infection. They should see their health care provider and be treated if they have problems, such as a mild rash or itching.  During treatment, it is important that you follow these instructions:  Avoid sexual activity or use condoms correctly.  Do not douche.  Avoid alcohol as directed by your health care provider.  Avoid breastfeeding as directed by your health care provider. SEEK MEDICAL CARE IF:   Your symptoms are not improving after 3 days of treatment.  You have increased discharge or pain.  You have a fever. MAKE SURE YOU:   Understand these instructions.  Will watch your condition.  Will get help right away if you are not doing well or get worse. FOR MORE INFORMATION  Centers for Disease Control and Prevention, Division of STD Prevention: [employer] American Sexual Health Association (ASHA): www.ashastd.org    This information is not intended to replace advice given to you by your health care provider. Make sure you discuss any questions you have with your health care provider.   Document Released: 02/03/2005 Document Revised: 02/24/2014 Document Reviewed: 09/15/2012 Elsevier Interactive  Patient Education 2016 [employer].

## 2015-07-30 LAB — HIV ANTIBODY (ROUTINE TESTING W REFLEX): HIV SCREEN 4TH GENERATION: NONREACTIVE

## 2015-07-30 LAB — RPR: RPR Ser Ql: NONREACTIVE

## 2015-07-30 LAB — GC/CHLAMYDIA PROBE AMP (~~LOC~~) NOT AT ARMC
Chlamydia: NEGATIVE
Neisseria Gonorrhea: NEGATIVE

## 2015-10-20 ENCOUNTER — Emergency Department (HOSPITAL_COMMUNITY)
Admission: EM | Admit: 2015-10-20 | Discharge: 2015-10-20 | Disposition: A | Discharge status: Home / Self Care | Payer: Medicaid Other | Attending: Emergency Medicine | Admitting: Emergency Medicine

## 2015-10-20 ENCOUNTER — Encounter (HOSPITAL_COMMUNITY): Payer: Self-pay

## 2015-10-20 DIAGNOSIS — K921 Melena: Secondary | ICD-10-CM | POA: Diagnosis not present

## 2015-10-20 DIAGNOSIS — R1033 Periumbilical pain: Secondary | ICD-10-CM | POA: Diagnosis not present

## 2015-10-20 DIAGNOSIS — F17210 Nicotine dependence, cigarettes, uncomplicated: Secondary | ICD-10-CM | POA: Diagnosis not present

## 2015-10-20 DIAGNOSIS — R109 Unspecified abdominal pain: Secondary | ICD-10-CM

## 2015-10-20 LAB — CBC
HCT: 31.9 % — ABNORMAL LOW (ref 36.0–46.0)
Hemoglobin: 10 g/dL — ABNORMAL LOW (ref 12.0–15.0)
MCH: 20.8 pg — AB (ref 26.0–34.0)
MCHC: 31.3 g/dL (ref 30.0–36.0)
MCV: 66.5 fL — ABNORMAL LOW (ref 78.0–100.0)
PLATELETS: 228 10*3/uL (ref 150–400)
RBC: 4.8 MIL/uL (ref 3.87–5.11)
RDW: 18.2 % — AB (ref 11.5–15.5)
WBC: 4.5 10*3/uL (ref 4.0–10.5)

## 2015-10-20 LAB — COMPREHENSIVE METABOLIC PANEL
ALK PHOS: 34 U/L — AB (ref 38–126)
ALT: 14 U/L (ref 14–54)
AST: 22 U/L (ref 15–41)
Albumin: 4.2 g/dL (ref 3.5–5.0)
Anion gap: 6 (ref 5–15)
BUN: 6 mg/dL (ref 6–20)
CALCIUM: 9.3 mg/dL (ref 8.9–10.3)
CO2: 22 mmol/L (ref 22–32)
CREATININE: 0.83 mg/dL (ref 0.44–1.00)
Chloride: 109 mmol/L (ref 101–111)
Glucose, Bld: 100 mg/dL — ABNORMAL HIGH (ref 65–99)
Potassium: 3.3 mmol/L — ABNORMAL LOW (ref 3.5–5.1)
SODIUM: 137 mmol/L (ref 135–145)
Total Bilirubin: 0.2 mg/dL — ABNORMAL LOW (ref 0.3–1.2)
Total Protein: 7.6 g/dL (ref 6.5–8.1)

## 2015-10-20 LAB — POC OCCULT BLOOD, ED: FECAL OCCULT BLD: NEGATIVE

## 2015-10-20 NOTE — ED Triage Notes (Signed)
Patient complains of lower abdominal cramping 2 days ago that has resolved. Yesterday developed nausea and reports 5 episodes of bright red rectal bleeding. No vomiting, NAD. Alert and oriented

## 2015-10-20 NOTE — Discharge Instructions (Signed)
We saw you in the ER for the abdominal pain. All the results in the ER are normal, labs and imaging. There was no evidence of blood in the stool. We are not sure what is causing your symptoms. The workup in the ER is not complete, and is limited to screening for life threatening and emergent conditions only, so please see a primary care doctor for further evaluation.  Return to the ER if you have large bloody stools again, you are unable to keep meds down.

## 2015-10-20 NOTE — ED Provider Notes (Signed)
[facility] DEPT Provider Note   CSN: [id number] Arrival date & time: 10/20/15  1223     History   Chief Complaint Chief Complaint  Patient presents with  . Abdominal Pain    HPI Frances Pearson is a 30 y.o. female.  HPI Pt comes in with abd cramping and BRBPR. Cramping x 3 days. Pt's pain is intermittent. She doesn't think the pain is from period. Pt's pain is periumbilical. She has no n/v/f/c. Pt had a loose BM with blood in it earlier today, which got her concerned. She has no uti like symptoms, vaginal discharge. She is married and has no concerns for STD.   Past Medical History:  Diagnosis Date  . Collapsed lung   . Iron deficiency anemia   . Vitamin D deficiency     There are no active problems to display for this patient.   Past Surgical History:  Procedure Laterality Date  . CHEST TUBE INSERTION      OB History    No data available       Home Medications    Prior to Admission medications   Medication Sig Start Date End Date Taking? Authorizing Provider  Multiple Vitamin (MULTIVITAMIN WITH MINERALS) TABS tablet Take 1 tablet by mouth daily.   Yes Historical Provider, MD    Family History No family history on file.  Social History Social History  Substance Use Topics  . Smoking status: Current Every Day Smoker    Packs/day: 0.50    Types: Cigarettes  . Smokeless tobacco: Never Used  . Alcohol use Yes     Comment: socially     Allergies   Penicillins   Review of Systems Review of Systems  ROS 10 Systems reviewed and are negative for acute change except as noted in the HPI.     Physical Exam Updated Vital Signs BP 121/76 (BP Location: Right Arm)   Pulse 77   Temp 98.2 F (36.8 C) (Oral)   Resp 13   Ht 5\' 5"  (1.651 m)   Wt 158 lb 4 oz (71.8 kg)   SpO2 100%   BMI 26.33 kg/m   Physical Exam  Constitutional: She is oriented to person, place, and time. She appears well-developed and well-nourished.  HENT:  Head: Normocephalic  and atraumatic.  Eyes: EOM are normal. Pupils are equal, round, and reactive to light.  Neck: Neck supple.  Cardiovascular: Normal rate, regular rhythm and normal heart sounds.   No murmur heard. Pulmonary/Chest: Effort normal. No respiratory distress.  Abdominal: Soft. She exhibits no distension. There is no tenderness. There is no rebound and no guarding.  DRE reveals no blood in the rectal vault, guaic neg  Neurological: She is alert and oriented to person, place, and time.  Skin: Skin is warm and dry.  Nursing note and vitals reviewed.    ED Treatments / Results  Labs (all labs ordered are listed, but only abnormal results are displayed) Labs Reviewed  COMPREHENSIVE METABOLIC PANEL - Abnormal; Notable for the following:       Result Value   Potassium 3.3 (*)    Glucose, Bld 100 (*)    Alkaline Phosphatase 34 (*)    Total Bilirubin 0.2 (*)    All other components within normal limits  CBC - Abnormal; Notable for the following:    Hemoglobin 10.0 (*)    HCT 31.9 (*)    MCV 66.5 (*)    MCH 20.8 (*)    RDW 18.2 (*)  All other components within normal limits  POC OCCULT BLOOD, ED    EKG  EKG Interpretation None       Radiology No results found.  Procedures Procedures (including critical care time)  Medications Ordered in ED Medications - No data to display   Initial Impression / Assessment and Plan / ED Course  I have reviewed the triage vital signs and the nursing notes.  Pertinent labs & imaging results that were available during my care of the patient were reviewed by me and considered in my medical decision making (see chart for details).  Clinical Course    Pt comes in with abd pain and bloody stools. No pain at the moment, and the exam is non tender. Pain is peri-umbilical. Pt also has bloody stools - but the DRE reveals no blood, and there is no anal fissures/hemorrhoids. Will check Hb.  Final Clinical Impressions(s) / ED Diagnoses   Final  diagnoses:  Abdominal cramping  Bloody stool    New Prescriptions Discharge Medication List as of 10/20/2015  3:45 PM       Varney Biles, MD 10/20/15 1730

## 2016-01-26 ENCOUNTER — Emergency Department (HOSPITAL_COMMUNITY)
Admission: EM | Admit: 2016-01-26 | Discharge: 2016-01-26 | Disposition: A | Discharge status: Home / Self Care | Payer: Medicaid Other | Attending: Emergency Medicine | Admitting: Emergency Medicine

## 2016-01-26 ENCOUNTER — Encounter (HOSPITAL_COMMUNITY): Payer: Self-pay

## 2016-01-26 DIAGNOSIS — N939 Abnormal uterine and vaginal bleeding, unspecified: Secondary | ICD-10-CM | POA: Insufficient documentation

## 2016-01-26 DIAGNOSIS — F17210 Nicotine dependence, cigarettes, uncomplicated: Secondary | ICD-10-CM | POA: Insufficient documentation

## 2016-01-26 LAB — I-STAT BETA HCG BLOOD, ED (MC, WL, AP ONLY)

## 2016-01-26 LAB — CBC
HEMATOCRIT: 29.2 % — AB (ref 36.0–46.0)
HEMOGLOBIN: 9.8 g/dL — AB (ref 12.0–15.0)
MCH: 23.8 pg — ABNORMAL LOW (ref 26.0–34.0)
MCHC: 33.6 g/dL (ref 30.0–36.0)
MCV: 70.9 fL — ABNORMAL LOW (ref 78.0–100.0)
Platelets: 323 10*3/uL (ref 150–400)
RBC: 4.12 MIL/uL (ref 3.87–5.11)
RDW: 20 % — ABNORMAL HIGH (ref 11.5–15.5)
WBC: 4.9 10*3/uL (ref 4.0–10.5)

## 2016-01-26 LAB — COMPREHENSIVE METABOLIC PANEL
ALBUMIN: 4.2 g/dL (ref 3.5–5.0)
ALK PHOS: 39 U/L (ref 38–126)
ALT: 21 U/L (ref 14–54)
AST: 22 U/L (ref 15–41)
Anion gap: 8 (ref 5–15)
BILIRUBIN TOTAL: 0.3 mg/dL (ref 0.3–1.2)
BUN: 13 mg/dL (ref 6–20)
CALCIUM: 9.3 mg/dL (ref 8.9–10.3)
CO2: 23 mmol/L (ref 22–32)
Chloride: 111 mmol/L (ref 101–111)
Creatinine, Ser: 1 mg/dL (ref 0.44–1.00)
GFR calc Af Amer: >60 mL/min (ref >60)
GFR calc non Af Amer: >60 mL/min (ref >60)
GLUCOSE: 75 mg/dL (ref 65–99)
Potassium: 3.9 mmol/L (ref 3.5–5.1)
SODIUM: 142 mmol/L (ref 135–145)
TOTAL PROTEIN: 7.3 g/dL (ref 6.5–8.1)

## 2016-01-26 LAB — WET PREP, GENITAL
CLUE CELLS WET PREP: NONE SEEN
Sperm: NONE SEEN
Trich, Wet Prep: NONE SEEN
Yeast Wet Prep HPF POC: NONE SEEN

## 2016-01-26 NOTE — Discharge Instructions (Addendum)
Follow-up with your OB-GYN regarding your irregular menses. Return here for new concerns.

## 2016-01-26 NOTE — ED Triage Notes (Signed)
Patient complains of recent menstrual cycle that was heavy 2 weeks ago and now has been bleeding heavy again x 2-3 days, states that she thinks she may be pregnant because the clots look like body parts. Patient in no distress.

## 2016-01-26 NOTE — ED Provider Notes (Signed)
[facility] DEPT Provider Note   CSN: [id number] Arrival date & time: 01/26/16  1304     History   Chief Complaint Chief Complaint  Patient presents with  . Vaginal Bleeding    HPI Frances Pearson is a 30 y.o. female.  The history is provided by the patient and medical records.  Vaginal Bleeding  Primary symptoms include vaginal bleeding.   30 year old female with history of iron deficiency anemia, vitamin D deficiency, presenting to the ED for vaginal bleeding. She reports she had a normal menstrual period 2 weeks ago, but began bleeding again 2 days ago. Patient expresses concern for pregnancy as she reports passage of clots and a small piece of tissue that "resembled an arm". States she is currently sexually active with her husband and has no concern for STD. She does report a thin, clear vaginal discharge which is chronic for her.  She does currently have an IUD in place. Patient does report irregular menstrual cycles over the past few months. She is not followed up with her OB/GYN for this.  Past Medical History:  Diagnosis Date  . Collapsed lung   . Iron deficiency anemia   . Vitamin D deficiency     There are no active problems to display for this patient.   Past Surgical History:  Procedure Laterality Date  . CHEST TUBE INSERTION      OB History    No data available       Home Medications    Prior to Admission medications   Medication Sig Start Date End Date Taking? Authorizing Provider  Multiple Vitamin (MULTIVITAMIN WITH MINERALS) TABS tablet Take 1 tablet by mouth daily.    Historical Provider, MD    Family History No family history on file.  Social History Social History  Substance Use Topics  . Smoking status: Current Every Day Smoker    Packs/day: 0.50    Types: Cigarettes  . Smokeless tobacco: Never Used  . Alcohol use Yes     Comment: socially     Allergies   Penicillins   Review of Systems Review of Systems  Genitourinary:  Positive for vaginal bleeding.  All other systems reviewed and are negative.    Physical Exam Updated Vital Signs BP 129/77 (BP Location: Left Arm)   Pulse 89   Temp 98.1 F (36.7 C) (Oral)   Resp 18   Ht 5\' 5"  (1.651 m)   Wt 72.6 kg   LMP 01/24/2016   SpO2 100%   BMI 26.63 kg/m   Physical Exam  Constitutional: She is oriented to person, place, and time. She appears well-developed and well-nourished.  HENT:  Head: Normocephalic and atraumatic.  Mouth/Throat: Oropharynx is clear and moist.  Eyes: Conjunctivae and EOM are normal. Pupils are equal, round, and reactive to light.  Neck: Normal range of motion.  Cardiovascular: Normal rate, regular rhythm and normal heart sounds.   Pulmonary/Chest: Effort normal and breath sounds normal. No respiratory distress. She has no wheezes.  Abdominal: Soft. Bowel sounds are normal. There is no tenderness.  Genitourinary:  Genitourinary Comments: Normal female external genitalia without visible lesions; moderate amount of blood present in vaginal vault with small clots intermixed; cervical os closed, IUD strings visible; no appreciable discharge; no adnexal or CMT  Musculoskeletal: Normal range of motion.  Neurological: She is alert and oriented to person, place, and time.  Skin: Skin is warm and dry.  Psychiatric: She has a normal mood and affect.  Nursing note and vitals reviewed.  ED Treatments / Results  Labs (all labs ordered are listed, but only abnormal results are displayed) Labs Reviewed  WET PREP, GENITAL - Abnormal; Notable for the following:       Result Value   WBC, Wet Prep HPF POC FEW (*)    All other components within normal limits  CBC - Abnormal; Notable for the following:    Hemoglobin 9.8 (*)    HCT 29.2 (*)    MCV 70.9 (*)    MCH 23.8 (*)    RDW 20.0 (*)    All other components within normal limits  COMPREHENSIVE METABOLIC PANEL  I-STAT BETA HCG BLOOD, ED ([facility], [facility], [facility] ONLY)  GC/CHLAMYDIA PROBE AMP ([facility]) NOT AT [facility]    EKG  EKG Interpretation None       Radiology No results found.  Procedures Procedures (including critical care time)  Medications Ordered in ED Medications - No data to display   Initial Impression / Assessment and Plan / ED Course  I have reviewed the triage vital signs and the nursing notes.  Pertinent labs & imaging results that were available during my care of the patient were reviewed by me and considered in my medical decision making (see chart for details).  Clinical Course    30 year old female who with vaginal bleeding. Reports period 2 weeks ago, began bleeding again 2 days ago. Concerned for pregnancy.  She is afebrile and nontoxic. Abdomen is soft and benign. She is hemodynamically stable. Screening lab work is reassuring, hcg <5. She does have a baseline anemia, hemoglobin today largely unchanged. Pelvic exam performed, moderate amount of bleeding noted, cervical os closed. IUD strings are visualized. No discharge. No adnexal or shoulder motion tenderness.  Wet prep without noted abnormalities.  Gc/chl pending, no expressed concern for STD.  Based on history, it seems she has been experiencing irregular periods for a few months now, do not feel she needs further emergent work-up for this today. I recommended that she follow-up with her OB/GYN for this.  Discussed plan with patient, he/she acknowledged understanding and agreed with plan of care.  Return precautions given for new or worsening symptoms.  Final Clinical Impressions(s) / ED Diagnoses   Final diagnoses:  Vaginal bleeding    New Prescriptions New Prescriptions   No medications on file     Larene Pickett, Hershal Coria 01/26/16 [place], MD 01/27/16 [phone]

## 2016-01-28 LAB — GC/CHLAMYDIA PROBE AMP (~~LOC~~) NOT AT ARMC
Chlamydia: NEGATIVE
NEISSERIA GONORRHEA: NEGATIVE

## 2016-08-07 ENCOUNTER — Encounter (HOSPITAL_COMMUNITY): Payer: Self-pay | Admitting: Emergency Medicine

## 2016-08-07 ENCOUNTER — Emergency Department (HOSPITAL_COMMUNITY)
Admission: EM | Admit: 2016-08-07 | Discharge: 2016-08-07 | Disposition: A | Discharge status: Home / Self Care | Payer: Medicaid Other | Attending: Emergency Medicine | Admitting: Emergency Medicine

## 2016-08-07 DIAGNOSIS — R319 Hematuria, unspecified: Secondary | ICD-10-CM | POA: Insufficient documentation

## 2016-08-07 DIAGNOSIS — R339 Retention of urine, unspecified: Secondary | ICD-10-CM | POA: Diagnosis present

## 2016-08-07 DIAGNOSIS — F17210 Nicotine dependence, cigarettes, uncomplicated: Secondary | ICD-10-CM | POA: Diagnosis not present

## 2016-08-07 DIAGNOSIS — N390 Urinary tract infection, site not specified: Secondary | ICD-10-CM | POA: Diagnosis not present

## 2016-08-07 LAB — URINALYSIS, ROUTINE W REFLEX MICROSCOPIC
Bilirubin Urine: NEGATIVE
Glucose, UA: NEGATIVE mg/dL
Ketones, ur: NEGATIVE mg/dL
Nitrite: POSITIVE — AB
PROTEIN: 100 mg/dL — AB
SPECIFIC GRAVITY, URINE: 1.026 (ref 1.005–1.030)
pH: 6 (ref 5.0–8.0)

## 2016-08-07 LAB — WET PREP, GENITAL
Clue Cells Wet Prep HPF POC: NONE SEEN
Sperm: NONE SEEN
Trich, Wet Prep: NONE SEEN
Yeast Wet Prep HPF POC: NONE SEEN

## 2016-08-07 LAB — POC URINE PREG, ED: PREG TEST UR: NEGATIVE

## 2016-08-07 MED ORDER — NITROFURANTOIN MONOHYD MACRO 100 MG PO CAPS: 100.0000 mg | ORAL_CAPSULE | Freq: Two times a day (BID) | ORAL | 0 refills | Status: DC

## 2016-08-07 NOTE — ED Notes (Signed)
Pt stable, understands discharge instructions, and reasons for return.   

## 2016-08-07 NOTE — Discharge Instructions (Signed)
Please read attached information. If you experience any new or worsening signs or symptoms please return to the emergency room for evaluation. Please follow-up with your primary care provider or specialist as discussed. Please use medication prescribed only as directed and discontinue taking if you have any concerning signs or symptoms.   °

## 2016-08-07 NOTE — ED Triage Notes (Signed)
Pt presents to ED for assessment of lower abdominal discomfort, discharge, fishy smelling, IUD in place, patient atates she is unable to urinate more than a tablespoon at a time,  X 1 week.

## 2016-08-07 NOTE — ED Provider Notes (Signed)
[facility] DEPT Provider Note   CSN: [id number] Arrival date & time: 08/07/16  1912  By signing my name below, I, Frances Pearson, attest that this documentation has been prepared under the direction and in the presence of [employer], PA-C. Electronically Signed: Hansel Pearson, ED Scribe. 08/07/16. 7:59 PM.    History   Chief Complaint Chief Complaint  Patient presents with  . Urinary Retention    HPI Frances Pearson is a 31 y.o. female who presents to the Emergency Department complaining of decreased urine volume for 3 days with associated suprapubic pain, cloudy urine. Pt reports she has only been able to urinate ~1 tbs 3x/day with her symptoms and additionally reports an "orgasm" sensation at the end of urination. She states she has been adequately hydrated, but is not drinking as much as normal. No h/o of similar symptoms. She denies dysuria.   She also complains of malodorous white vaginal d/c for 1-2w, which she reports is similar to a yeast infection or BV, in addition to vaginal bleeding. LMP 2 weeks ago. She denies fever, back pain. Pt states she has had an IUD in place for a year but has not checked the strings in 2 months.   The history is provided by the patient. No language interpreter was used.    Past Medical History:  Diagnosis Date  . Collapsed lung   . Iron deficiency anemia   . Vitamin D deficiency     There are no active problems to display for this patient.   Past Surgical History:  Procedure Laterality Date  . CHEST TUBE INSERTION      OB History    No data available       Home Medications    Prior to Admission medications   Medication Sig Start Date End Date Taking? Authorizing Provider  Multiple Vitamin (MULTIVITAMIN WITH MINERALS) TABS tablet Take 1 tablet by mouth daily.    [provider]  nitrofurantoin, macrocrystal-monohydrate, (MACROBID) 100 MG capsule Take 1 capsule (100 mg total) by mouth 2 (two) times daily. 08/07/16   Okey Regal, PA-C    Family History History reviewed. No pertinent family history.  Social History Social History  Substance Use Topics  . Smoking status: Current Every Day Smoker    Packs/day: 0.25    Types: Cigarettes  . Smokeless tobacco: Never Used  . Alcohol use Yes     Comment: socially     Allergies   Penicillins   Review of Systems Review of Systems  Constitutional: Negative for fever.  Gastrointestinal: Positive for abdominal pain.  Genitourinary: Positive for decreased urine volume, vaginal bleeding and vaginal discharge. Negative for dysuria.  Musculoskeletal: Negative for back pain.  All other systems reviewed and are negative.    Physical Exam Updated Vital Signs BP 117/84 (BP Location: Right Arm)   Pulse 91   Temp 98.1 F (36.7 C) (Oral)   Resp 17   LMP 07/18/2016   SpO2 100%   Physical Exam  Constitutional: She appears well-developed and well-nourished.  HENT:  Head: Normocephalic.  Eyes: Conjunctivae are normal.  Cardiovascular: Normal rate.   Pulmonary/Chest: Effort normal. No respiratory distress.  Abdominal: Soft. She exhibits no distension. There is tenderness.  Very minor suprapubic tenderness. No distention.   Genitourinary:  Genitourinary Comments: Clumpy white discharge, no cervical motion tenderness  Musculoskeletal: Normal range of motion.  Neurological: She is alert.  Skin: Skin is warm and dry.  Psychiatric: She has a normal mood and affect. Her behavior  is normal.  Nursing note and vitals reviewed.    ED Treatments / Results   DIAGNOSTIC STUDIES: Oxygen Saturation is 100% on RA, normal by my interpretation.    COORDINATION OF CARE: 7:55 PM Discussed treatment plan with pt at bedside which includes labs, bladder scan and pt agreed to plan.    Labs (all labs ordered are listed, but only abnormal results are displayed) Labs Reviewed  WET PREP, GENITAL - Abnormal; Notable for the following:       Result Value   WBC, Wet  Prep HPF POC FEW (*)    All other components within normal limits  URINALYSIS, ROUTINE W REFLEX MICROSCOPIC - Abnormal; Notable for the following:    Color, Urine AMBER (*)    APPearance TURBID (*)    Hgb urine dipstick LARGE (*)    Protein, ur 100 (*)    Nitrite POSITIVE (*)    Leukocytes, UA LARGE (*)    Bacteria, UA MANY (*)    Squamous Epithelial / LPF 0-5 (*)    All other components within normal limits  URINE CULTURE  POC URINE PREG, ED  GC/CHLAMYDIA PROBE AMP ([place]) NOT AT [facility]    EKG  EKG Interpretation None       Radiology No results found.  Procedures Procedures (including critical care time)  Medications Ordered in ED Medications - No data to display   Initial Impression / Assessment and Plan / ED Course  I have reviewed the triage vital signs and the nursing notes.  Pertinent labs & imaging results that were available during my care of the patient were reviewed by me and considered in my medical decision making (see chart for details).     Labs: UA, urine preg  Imaging:  Consults:  Therapeutics:  Discharge Meds:   Assessment/Plan: 31 year old female presents today with complaints of urinary retention and dysuria.  Patient has a urinary tract infection here, she will be started on Macrobid.  She has no signs of pyelonephritis or complicated infection.  Patient also having odorous discharge similar to previous bacterial vaginosis infection.  She has been started on MetroGel at this time by her primary care.  Patient does have what appears to be clumpy discharge.  No significant findings on her wet prep.  Patient will be referred to her primary care for ongoing management.  Patient's bladder shows very little urine, very little concern for urinary retention.  Return precautions given follow-up information given she verbalized understanding and agreement to this plan.     Final Clinical Impressions(s) / ED Diagnoses   Final diagnoses:  Urinary  tract infection with hematuria, site unspecified    New Prescriptions New Prescriptions   NITROFURANTOIN, MACROCRYSTAL-MONOHYDRATE, (MACROBID) 100 MG CAPSULE    Take 1 capsule (100 mg total) by mouth 2 (two) times daily.    I personally performed the services described in this documentation, which was scribed in my presence. The recorded information has been reviewed and is accurate.    Okey Regal, PA-C 08/07/16 2109    Lajean Saver, MD 08/11/16 [phone]

## 2016-08-08 LAB — GC/CHLAMYDIA PROBE AMP (~~LOC~~) NOT AT ARMC
Chlamydia: NEGATIVE
Neisseria Gonorrhea: NEGATIVE

## 2016-08-10 LAB — URINE CULTURE: Culture: >=100000 — AB

## 2016-08-11 ENCOUNTER — Telehealth: Payer: Self-pay | Admitting: Emergency Medicine

## 2016-08-11 NOTE — Telephone Encounter (Signed)
Post ED Visit - Positive Culture Follow-up  Culture report reviewed by antimicrobial stewardship pharmacist:  []  Elenor Quinones, Pharm.D. []  Heide Guile, Pharm.D., BCPS AQ-ID []  Parks Neptune, Pharm.D., BCPS []  Alycia Rossetti, Pharm.D., BCPS []  [place], Pharm.D., BCPS, AAHIVP [x]  Legrand Como, Pharm.D., BCPS, AAHIVP []  Salome Arnt, PharmD, BCPS []  Dimitri Ped, PharmD, BCPS []  Vincenza Hews, PharmD, BCPS  Positive urine culture Treated with nitrofurantoin, organism sensitive to the same and no further patient follow-up is required at this time.  Hazle Nordmann 08/11/2016, 2:24 PM

## 2016-09-05 ENCOUNTER — Other Ambulatory Visit: Payer: Self-pay | Admitting: Internal Medicine

## 2016-09-05 ENCOUNTER — Other Ambulatory Visit: Payer: Self-pay | Admitting: Family

## 2016-09-05 DIAGNOSIS — N6320 Unspecified lump in the left breast, unspecified quadrant: Secondary | ICD-10-CM

## 2016-09-11 ENCOUNTER — Ambulatory Visit
Admission: RE | Admit: 2016-09-11 | Discharge: 2016-09-11 | Disposition: A | Discharge status: Home / Self Care | Payer: Medicaid Other | Source: Ambulatory Visit | Attending: Internal Medicine | Admitting: Internal Medicine

## 2016-09-11 ENCOUNTER — Other Ambulatory Visit: Payer: Self-pay | Admitting: Internal Medicine

## 2016-09-11 DIAGNOSIS — N6320 Unspecified lump in the left breast, unspecified quadrant: Secondary | ICD-10-CM

## 2017-02-27 ENCOUNTER — Other Ambulatory Visit: Payer: Self-pay

## 2017-02-27 ENCOUNTER — Encounter (HOSPITAL_COMMUNITY): Payer: Self-pay | Admitting: *Deleted

## 2017-02-27 DIAGNOSIS — N760 Acute vaginitis: Secondary | ICD-10-CM | POA: Insufficient documentation

## 2017-02-27 DIAGNOSIS — B9689 Other specified bacterial agents as the cause of diseases classified elsewhere: Secondary | ICD-10-CM | POA: Insufficient documentation

## 2017-02-27 DIAGNOSIS — R3915 Urgency of urination: Secondary | ICD-10-CM | POA: Insufficient documentation

## 2017-02-27 DIAGNOSIS — F17210 Nicotine dependence, cigarettes, uncomplicated: Secondary | ICD-10-CM | POA: Diagnosis not present

## 2017-02-27 DIAGNOSIS — R300 Dysuria: Secondary | ICD-10-CM | POA: Diagnosis present

## 2017-02-27 DIAGNOSIS — R350 Frequency of micturition: Secondary | ICD-10-CM | POA: Insufficient documentation

## 2017-02-27 DIAGNOSIS — R109 Unspecified abdominal pain: Secondary | ICD-10-CM | POA: Insufficient documentation

## 2017-02-27 LAB — COMPREHENSIVE METABOLIC PANEL
ALBUMIN: 4 g/dL (ref 3.5–5.0)
ALK PHOS: 35 U/L — AB (ref 38–126)
ALT: 16 U/L (ref 14–54)
ANION GAP: 6 (ref 5–15)
AST: 23 U/L (ref 15–41)
BILIRUBIN TOTAL: 0.6 mg/dL (ref 0.3–1.2)
BUN: 5 mg/dL — AB (ref 6–20)
CALCIUM: 9.3 mg/dL (ref 8.9–10.3)
CO2: 25 mmol/L (ref 22–32)
Chloride: 105 mmol/L (ref 101–111)
Creatinine, Ser: 0.78 mg/dL (ref 0.44–1.00)
GFR calc Af Amer: >60 mL/min (ref >60)
GFR calc non Af Amer: >60 mL/min (ref >60)
GLUCOSE: 95 mg/dL (ref 65–99)
Potassium: 3.4 mmol/L — ABNORMAL LOW (ref 3.5–5.1)
Sodium: 136 mmol/L (ref 135–145)
TOTAL PROTEIN: 7.1 g/dL (ref 6.5–8.1)

## 2017-02-27 LAB — CBC
HCT: 37.3 % (ref 36.0–46.0)
Hemoglobin: 13.2 g/dL (ref 12.0–15.0)
MCH: 27.1 pg (ref 26.0–34.0)
MCHC: 35.4 g/dL (ref 30.0–36.0)
MCV: 76.6 fL — ABNORMAL LOW (ref 78.0–100.0)
Platelets: 245 10*3/uL (ref 150–400)
RBC: 4.87 MIL/uL (ref 3.87–5.11)
RDW: 15 % (ref 11.5–15.5)
WBC: 7.9 10*3/uL (ref 4.0–10.5)

## 2017-02-27 LAB — URINALYSIS, ROUTINE W REFLEX MICROSCOPIC
BILIRUBIN URINE: NEGATIVE
Glucose, UA: NEGATIVE mg/dL
Hgb urine dipstick: NEGATIVE
Ketones, ur: NEGATIVE mg/dL
LEUKOCYTES UA: NEGATIVE
NITRITE: NEGATIVE
PROTEIN: NEGATIVE mg/dL
Specific Gravity, Urine: 1.016 (ref 1.005–1.030)
pH: 5 (ref 5.0–8.0)

## 2017-02-27 LAB — I-STAT BETA HCG BLOOD, ED (MC, WL, AP ONLY)

## 2017-02-27 LAB — LIPASE, BLOOD: Lipase: 33 U/L (ref 11–51)

## 2017-02-27 NOTE — ED Triage Notes (Addendum)
Pt states lower abdominal pain, fishy-smelling urine and urinary urgency.  This week pain increased, itching to her labia, and pt noted some bloody discharge with a stronger fish smell.

## 2017-02-28 ENCOUNTER — Emergency Department (HOSPITAL_COMMUNITY)
Admission: EM | Admit: 2017-02-28 | Discharge: 2017-02-28 | Disposition: A | Discharge status: Home / Self Care | Payer: Medicaid Other | Attending: Emergency Medicine | Admitting: Emergency Medicine

## 2017-02-28 DIAGNOSIS — N760 Acute vaginitis: Secondary | ICD-10-CM

## 2017-02-28 DIAGNOSIS — B9689 Other specified bacterial agents as the cause of diseases classified elsewhere: Secondary | ICD-10-CM

## 2017-02-28 LAB — WET PREP, GENITAL
Sperm: NONE SEEN
TRICH WET PREP: NONE SEEN
Yeast Wet Prep HPF POC: NONE SEEN

## 2017-02-28 LAB — GC/CHLAMYDIA PROBE AMP (~~LOC~~) NOT AT ARMC
Chlamydia: NEGATIVE
NEISSERIA GONORRHEA: NEGATIVE

## 2017-02-28 MED ORDER — METRONIDAZOLE 500 MG PO TABS
500.0000 mg | ORAL_TABLET | Freq: Once | ORAL | Status: AC
  Administered 2017-02-28: 500 mg via ORAL
  Filled 2017-02-28: qty 1

## 2017-02-28 MED ORDER — METRONIDAZOLE 500 MG PO TABS: 500.0000 mg | ORAL_TABLET | Freq: Two times a day (BID) | ORAL | 0 refills | Status: DC

## 2017-02-28 NOTE — Discharge Instructions (Addendum)
You may alternate Tylenol 1000 mg every 6 hours as needed for pain and Ibuprofen 800 mg every 8 hours as needed for pain.  Please take Ibuprofen with food.     [facility] [employer] [email or web address] [place] [address] [place], [place] [phone]    [email or web address] [address] [place], [place] ([place]) [place] # [place], [place] [phone]   [facility] [email or web address] [address] [place], [place] [phone]   [facility] [email or web address] [address] [place], [place] [phone]   [facility] [email or web address], [place] [phone]

## 2017-02-28 NOTE — ED Provider Notes (Signed)
TIME SEEN: 2:40 AM  CHIEF COMPLAINT: Dysuria, vaginal discharge  HPI: Patient is a 32 year old female with no significant past medical history who is a G1 P1 who presents to the emergency department with complaints of dysuria, urinary frequency and urgency, vaginal discharge with foul odor.  Symptoms present for several days.  No previous history of STDs.  Has had intermittent diffuse abdominal pain that she states moves to different locations.  No abdominal pain currently.  No fevers, nausea, vomiting, diarrhea.  States that she thinks that she has a urinary tract infection.  ROS: See HPI Constitutional: no fever  Eyes: no drainage  ENT: no runny nose   Cardiovascular:  no chest pain  Resp: no SOB  GI: no vomiting GU: no dysuria Integumentary: no rash  Allergy: no hives  Musculoskeletal: no leg swelling  Neurological: no slurred speech ROS otherwise negative  PAST MEDICAL HISTORY/PAST SURGICAL HISTORY:  Past Medical History:  Diagnosis Date  . Collapsed lung   . Iron deficiency anemia   . Vitamin D deficiency     MEDICATIONS:  Prior to Admission medications   Medication Sig Start Date End Date Taking? Authorizing Provider  nitrofurantoin, macrocrystal-monohydrate, (MACROBID) 100 MG capsule Take 1 capsule (100 mg total) by mouth 2 (two) times daily. Patient not taking: Reported on 02/28/2017 08/07/16   Okey Regal, PA-C    ALLERGIES:  Allergies  Allergen Reactions  . Penicillins Hives    *okay with AMOXICILLIN*  Has patient had a PCN reaction causing immediate rash, facial/tongue/throat swelling, SOB or lightheadedness with hypotension: unknown Has patient had a PCN reaction causing severe rash involving mucus membranes or skin necrosis: unknown Has patient had a PCN reaction that required hospitalization: No  Has patient had a PCN reaction occurring within the last 10 years: Yes  If all of the above answers are "NO", then may proceed with Cephalosporin use.      SOCIAL HISTORY:  Social History   Tobacco Use  . Smoking status: Current Every Day Smoker    Packs/day: 0.25    Types: Cigarettes  . Smokeless tobacco: Never Used  Substance Use Topics  . Alcohol use: Yes    Comment: Binge drinking    FAMILY HISTORY: No family history on file.  EXAM: BP 120/73   Pulse 75   Temp 98.6 F (37 C) (Oral)   Resp 18   Ht 5\' 5"  (1.651 m)   Wt 72.6 kg (160 lb)   LMP 02/01/2017   SpO2 100%   BMI 26.63 kg/m  CONSTITUTIONAL: Alert and oriented and responds appropriately to questions. Well-appearing; well-nourished HEAD: Normocephalic EYES: Conjunctivae clear, pupils appear equal, EOMI ENT: normal nose; moist mucous membranes NECK: Supple, no meningismus, no nuchal rigidity, no LAD  CARD: RRR; S1 and S2 appreciated; no murmurs, no clicks, no rubs, no gallops RESP: Normal chest excursion without splinting or tachypnea; breath sounds clear and equal bilaterally; no wheezes, no rhonchi, no rales, no hypoxia or respiratory distress, speaking full sentences ABD/GI: Normal bowel sounds; non-distended; soft, non-tender, no rebound, no guarding, no peritoneal signs, no hepatosplenomegaly GU:  Normal external genitalia. No lesions, rashes noted. Patient has no vaginal bleeding on exam.  Moderate amount of white vaginal discharge.  No adnexal tenderness, mass or fullness, no cervical motion tenderness. Cervix is not appear friable.  Cervix is closed.  Chaperone present for exam. BACK:  The back appears normal and is non-tender to palpation, there is no CVA tenderness EXT: Normal ROM in all joints; non-tender to  palpation; no edema; normal capillary refill; no cyanosis, no calf tenderness or swelling    SKIN: Normal color for age and race; warm; no rash NEURO: Moves all extremities equally PSYCH: The patient's mood and manner are appropriate. Grooming and personal hygiene are appropriate.  MEDICAL DECISION MAKING: Patient here with complaints of vaginal  discharge, dysuria and hematuria.  Urine shows [place] no sign of infection but she is concerned this could be a UTI.  Will send urine culture.  Abdominal exam completely benign.  Doubt appendicitis.  Her pregnancy test negative.  Labs unremarkable.  Wet prep is positive for clue cells.  Suspect that this is the cause of her symptoms today.  Will discharge on Flagyl.  Recommend alternating Tylenol and Motrin.  She declines any medications here for pain.    At this time, I do not feel there is any life-threatening condition present. I have reviewed and discussed all results (EKG, imaging, lab, urine as appropriate) and exam findings with patient/family. I have reviewed nursing notes and appropriate previous records.  I feel the patient is safe to be discharged home without further emergent workup and can continue workup as an outpatient as needed. Discussed usual and customary return precautions. Patient/family verbalize understanding and are comfortable with this plan.  Outpatient follow-up has been provided if needed. All questions have been answered.      Benelli Winther, Delice Bison, DO 02/28/17 [phone]

## 2017-03-02 LAB — URINE CULTURE

## 2017-03-16 ENCOUNTER — Other Ambulatory Visit: Payer: Medicaid Other

## 2017-03-19 ENCOUNTER — Other Ambulatory Visit: Payer: Self-pay | Admitting: Internal Medicine

## 2017-03-19 ENCOUNTER — Ambulatory Visit
Admission: RE | Admit: 2017-03-19 | Discharge: 2017-03-19 | Disposition: A | Discharge status: Home / Self Care | Payer: Medicaid Other | Source: Ambulatory Visit | Attending: Internal Medicine | Admitting: Internal Medicine

## 2017-03-19 DIAGNOSIS — N6320 Unspecified lump in the left breast, unspecified quadrant: Secondary | ICD-10-CM

## 2017-07-15 ENCOUNTER — Emergency Department (HOSPITAL_COMMUNITY): Payer: Medicaid Other

## 2017-07-15 ENCOUNTER — Encounter (HOSPITAL_COMMUNITY): Payer: Self-pay | Admitting: Emergency Medicine

## 2017-07-15 ENCOUNTER — Emergency Department (HOSPITAL_COMMUNITY)
Admission: EM | Admit: 2017-07-15 | Discharge: 2017-07-15 | Disposition: A | Discharge status: Home / Self Care | Payer: Medicaid Other | Attending: Physician Assistant | Admitting: Physician Assistant

## 2017-07-15 DIAGNOSIS — B349 Viral infection, unspecified: Secondary | ICD-10-CM | POA: Insufficient documentation

## 2017-07-15 DIAGNOSIS — R0602 Shortness of breath: Secondary | ICD-10-CM | POA: Diagnosis present

## 2017-07-15 LAB — BASIC METABOLIC PANEL
Anion gap: 4 — ABNORMAL LOW (ref 5–15)
BUN: 8 mg/dL (ref 6–20)
CALCIUM: 9.1 mg/dL (ref 8.9–10.3)
CO2: 28 mmol/L (ref 22–32)
Chloride: 107 mmol/L (ref 101–111)
Creatinine, Ser: 0.86 mg/dL (ref 0.44–1.00)
GFR calc Af Amer: >60 mL/min (ref >60)
GLUCOSE: 94 mg/dL (ref 65–99)
Potassium: 3.9 mmol/L (ref 3.5–5.1)
Sodium: 139 mmol/L (ref 135–145)

## 2017-07-15 LAB — CBC WITH DIFFERENTIAL/PLATELET
ABS IMMATURE GRANULOCYTES: 0 10*3/uL (ref 0.0–0.1)
BASOS PCT: 1 %
Basophils Absolute: 0.1 10*3/uL (ref 0.0–0.1)
EOS PCT: 1 %
Eosinophils Absolute: 0.1 10*3/uL (ref 0.0–0.7)
HCT: 36.4 % (ref 36.0–46.0)
HEMOGLOBIN: 12.7 g/dL (ref 12.0–15.0)
Immature Granulocytes: 0 %
Lymphocytes Relative: 36 %
Lymphs Abs: 2.3 10*3/uL (ref 0.7–4.0)
MCH: 27 pg (ref 26.0–34.0)
MCHC: 34.9 g/dL (ref 30.0–36.0)
MCV: 77.4 fL — AB (ref 78.0–100.0)
MONO ABS: 0.7 10*3/uL (ref 0.1–1.0)
MONOS PCT: 11 %
NEUTROS ABS: 3.2 10*3/uL (ref 1.7–7.7)
Neutrophils Relative %: 51 %
PLATELETS: 253 10*3/uL (ref 150–400)
RBC: 4.7 MIL/uL (ref 3.87–5.11)
RDW: 14.6 % (ref 11.5–15.5)
WBC: 6.3 10*3/uL (ref 4.0–10.5)

## 2017-07-15 LAB — BRAIN NATRIURETIC PEPTIDE: B Natriuretic Peptide: 4.3 pg/mL (ref 0.0–100.0)

## 2017-07-15 LAB — PREGNANCY, URINE: PREG TEST UR: NEGATIVE

## 2017-07-15 LAB — D-DIMER, QUANTITATIVE (NOT AT ARMC)

## 2017-07-15 LAB — I-STAT TROPONIN, ED: Troponin i, poc: 0 ng/mL (ref 0.00–0.08)
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
## 2017-07-15 NOTE — ED Triage Notes (Signed)
Patient to ED with multiple complaints, main complaint of SOB and pain in L upper back when taking deep breaths onset today. Patient denies CP, no dizziness, no N/V. Resp e/u, skin warm/dry. Patient adds that she would like HIV testing and reports rash to L elbow.

## 2017-07-15 NOTE — Discharge Instructions (Addendum)
We are unsure what is causing all of your symptoms.  We are reassured that your vital signs, labs, chest x-ray at follow-up are normal.  Recommend he follow-up with primary care physician for any further complaints.  To find a primary care or specialty doctor please call [phone] or [phone] to access "[facility] a Doctor Service."  You may also go on the [facility] website at [email or web address]  There are also multiple [facility] practices throughout the [place] that are frequently accepting new patients. You may find a clinic that is close to your home and contact them.  [facility] -  [address] [place] [address] [phone]  [facility] in [place] (also locations in [place] and [place]) -  [place] [address]  [place] [address] [phone]

## 2017-07-15 NOTE — ED Provider Notes (Signed)
[facility] EMERGENCY DEPARTMENT Provider Note   CSN: [id number] Arrival date & time: 07/15/17  1718     History   Chief Complaint Chief Complaint  Patient presents with  . Multiple Complaints    HPI Frances Pearson is a 32 y.o. female.  HPI   Patient is a 32 year old female presenting with multiple complaints.  Patient says she has shortness of breath, occasional chest pains in the back of her "lungs".  She has had this for couple days.  Patient's got a very poor historian.  She is concerned because she also thinks she may have HIV.  She also thinks she may have contracted "mildew in her veins".  Patient reports that she was doing spring cleaning and thinks that she might have inhaled mold.  It is a dry cough occasionally.  Past Medical History:  Diagnosis Date  . Collapsed lung   . Iron deficiency anemia   . Vitamin D deficiency     There are no active problems to display for this patient.   Past Surgical History:  Procedure Laterality Date  . CHEST TUBE INSERTION       OB History    Gravida  1   Para      Term      Preterm      AB      Living        SAB      TAB      Ectopic      Multiple      Live Births               Home Medications    Prior to Admission medications   Medication Sig Start Date End Date Taking? Authorizing Provider  B Complex-C (B-COMPLEX WITH VITAMIN C) tablet Take 1 tablet by mouth daily.   Yes [provider]  metroNIDAZOLE (FLAGYL) 500 MG tablet Take 1 tablet (500 mg total) by mouth 2 (two) times daily. Patient not taking: Reported on 07/15/2017 02/28/17   Ward, Delice Bison, DO  nitrofurantoin, macrocrystal-monohydrate, (MACROBID) 100 MG capsule Take 1 capsule (100 mg total) by mouth 2 (two) times daily. Patient not taking: Reported on 02/28/2017 08/07/16   Okey Regal, PA-C    Family History No family history on file.  Social History Social History   Tobacco Use  . Smoking status:  Current Every Day Smoker    Packs/day: 0.25    Types: Cigarettes  . Smokeless tobacco: Never Used  Substance Use Topics  . Alcohol use: Yes    Comment: Binge drinking  . Drug use: Yes    Types: Marijuana     Allergies   Penicillins   Review of Systems Review of Systems  Constitutional: Positive for fatigue. Negative for activity change and fever.  Respiratory: Positive for cough and chest tightness. Negative for shortness of breath.   Cardiovascular: Negative for chest pain.  Gastrointestinal: Positive for constipation. Negative for abdominal pain.  All other systems reviewed and are negative.    Physical Exam Updated Vital Signs BP 119/71   Pulse 70   Temp 98.5 F (36.9 C)   Resp 17   Ht 5\' 5"  (1.651 m)   Wt 74.8 kg (165 lb)   LMP 07/15/2017 (Exact Date)   SpO2 99%   Breastfeeding? Unknown   BMI 27.46 kg/m   Physical Exam  Constitutional: She is oriented to person, place, and time. She appears well-developed and well-nourished.  HENT:  Head: Normocephalic and atraumatic.  Eyes: Right eye exhibits no discharge. Left eye exhibits no discharge.  Cardiovascular: Normal rate.  Pulmonary/Chest: Effort normal and breath sounds normal. No respiratory distress.  Abdominal: Soft. Bowel sounds are normal. She exhibits no distension. There is no tenderness.  Neurological: She is oriented to person, place, and time.  Skin: Skin is warm and dry. She is not diaphoretic.  Psychiatric: She has a normal mood and affect.  Nursing note and vitals reviewed.    ED Treatments / Results  Labs (all labs ordered are listed, but only abnormal results are displayed) Labs Reviewed  BASIC METABOLIC PANEL - Abnormal; Notable for the following components:      Result Value   Anion gap 4 (*)    All other components within normal limits  CBC WITH DIFFERENTIAL/PLATELET - Abnormal; Notable for the following components:   MCV 77.4 (*)    All other components within normal limits  BRAIN  NATRIURETIC PEPTIDE  D-DIMER, QUANTITATIVE (NOT AT [facility])  RPR  HIV ANTIBODY (ROUTINE TESTING)  I-STAT TROPONIN, ED    EKG None  Radiology Dg Chest 2 View  Result Date: 07/15/2017 CLINICAL DATA:  Shortness of breath EXAM: CHEST - 2 VIEW COMPARISON:  None. FINDINGS: The heart size and mediastinal contours are within normal limits. Both lungs are clear. The visualized skeletal structures are unremarkable. IMPRESSION: No active cardiopulmonary disease. Electronically Signed   By: Kathreen Devoid   On: 07/15/2017 19:44    Procedures Procedures (including critical care time)  Medications Ordered in ED Medications - No data to display   Initial Impression / Assessment and Plan / ED Course  I have reviewed the triage vital signs and the nursing notes.  Pertinent labs & imaging results that were available during my care of the patient were reviewed by me and considered in my medical decision making (see chart for details).    Patient is a 32 year old female presenting with multiple complaints.  Patient says she has shortness of breath, occasional chest pains in the back of her "lungs".  She has had this for couple days.  Patient's got a very poor historian.  She is concerned because she also thinks she may have HIV.  She also thinks she may have contracted "mildew in her veins".  Patient reports that she was doing spring cleaning and thinks that she might have inhaled mold.  It is a dry cough occasionally.  11:30 PM Patient has normal physical exam normal vital signs.  Labs are reassuring, chest x-ray normal.  Will send HIV for her concerns.  However I do not suspect any acute lung disease given her normal respirations, normal lung exam, and normal chest x-ray.  We will have her continue to monitor symptoms at home, follow-up with PCP.  Final Clinical Impressions(s) / ED Diagnoses   Final diagnoses:  None    ED Discharge Orders    None       Macarthur Critchley, MD 07/15/17  2330

## 2017-07-16 LAB — HIV ANTIBODY (ROUTINE TESTING W REFLEX): HIV SCREEN 4TH GENERATION: NONREACTIVE

## 2017-07-16 LAB — RPR: RPR: NONREACTIVE

## 2017-08-20 ENCOUNTER — Other Ambulatory Visit: Payer: Self-pay

## 2017-08-20 ENCOUNTER — Emergency Department (HOSPITAL_COMMUNITY): Payer: Medicaid Other

## 2017-08-20 ENCOUNTER — Inpatient Hospital Stay (HOSPITAL_COMMUNITY)
Admission: EM | Admit: 2017-08-20 | Discharge: 2017-08-22 | DRG: 812 | Disposition: A | Discharge status: Home / Self Care | Payer: Medicaid Other | Attending: Internal Medicine | Admitting: Internal Medicine

## 2017-08-20 ENCOUNTER — Encounter (HOSPITAL_COMMUNITY): Payer: Self-pay | Admitting: Emergency Medicine

## 2017-08-20 DIAGNOSIS — R011 Cardiac murmur, unspecified: Secondary | ICD-10-CM

## 2017-08-20 DIAGNOSIS — N939 Abnormal uterine and vaginal bleeding, unspecified: Secondary | ICD-10-CM

## 2017-08-20 DIAGNOSIS — D649 Anemia, unspecified: Secondary | ICD-10-CM

## 2017-08-20 DIAGNOSIS — R000 Tachycardia, unspecified: Secondary | ICD-10-CM | POA: Diagnosis present

## 2017-08-20 DIAGNOSIS — Z842 Family history of other diseases of the genitourinary system: Secondary | ICD-10-CM

## 2017-08-20 DIAGNOSIS — D259 Leiomyoma of uterus, unspecified: Secondary | ICD-10-CM | POA: Diagnosis not present

## 2017-08-20 DIAGNOSIS — D500 Iron deficiency anemia secondary to blood loss (chronic): Secondary | ICD-10-CM

## 2017-08-20 DIAGNOSIS — Z87828 Personal history of other (healed) physical injury and trauma: Secondary | ICD-10-CM

## 2017-08-20 DIAGNOSIS — Z9889 Other specified postprocedural states: Secondary | ICD-10-CM

## 2017-08-20 DIAGNOSIS — Z8759 Personal history of other complications of pregnancy, childbirth and the puerperium: Secondary | ICD-10-CM

## 2017-08-20 DIAGNOSIS — I959 Hypotension, unspecified: Secondary | ICD-10-CM | POA: Diagnosis present

## 2017-08-20 DIAGNOSIS — D62 Acute posthemorrhagic anemia: Principal | ICD-10-CM | POA: Diagnosis present

## 2017-08-20 DIAGNOSIS — F17210 Nicotine dependence, cigarettes, uncomplicated: Secondary | ICD-10-CM

## 2017-08-20 DIAGNOSIS — Z793 Long term (current) use of hormonal contraceptives: Secondary | ICD-10-CM

## 2017-08-20 DIAGNOSIS — Z975 Presence of (intrauterine) contraceptive device: Secondary | ICD-10-CM

## 2017-08-20 DIAGNOSIS — N938 Other specified abnormal uterine and vaginal bleeding: Secondary | ICD-10-CM | POA: Diagnosis present

## 2017-08-20 DIAGNOSIS — Z880 Allergy status to penicillin: Secondary | ICD-10-CM

## 2017-08-20 HISTORY — DX: Depression, unspecified: F32.A

## 2017-08-20 HISTORY — DX: Anemia, unspecified: D64.9

## 2017-08-20 HISTORY — DX: Personal history of other medical treatment: Z92.89

## 2017-08-20 HISTORY — DX: Anxiety disorder, unspecified: F41.9

## 2017-08-20 HISTORY — DX: Major depressive disorder, single episode, unspecified: F32.9

## 2017-08-20 HISTORY — DX: Unspecified osteoarthritis, unspecified site: M19.90

## 2017-08-20 HISTORY — DX: Reaction to severe stress, unspecified: F43.9

## 2017-08-20 LAB — CBC WITH DIFFERENTIAL/PLATELET
ABS IMMATURE GRANULOCYTES: 0.1 10*3/uL (ref 0.0–0.1)
BASOS ABS: 0 10*3/uL (ref 0.0–0.1)
Basophils Relative: 0 %
Eosinophils Absolute: 0 10*3/uL (ref 0.0–0.7)
Eosinophils Relative: 0 %
HCT: 20.3 % — ABNORMAL LOW (ref 36.0–46.0)
Hemoglobin: 6.9 g/dL — CL (ref 12.0–15.0)
Immature Granulocytes: 1 %
Lymphocytes Relative: 20 %
Lymphs Abs: 2.7 10*3/uL (ref 0.7–4.0)
MCH: 26.7 pg (ref 26.0–34.0)
MCHC: 34 g/dL (ref 30.0–36.0)
MCV: 78.7 fL (ref 78.0–100.0)
MONO ABS: 0.8 10*3/uL (ref 0.1–1.0)
Monocytes Relative: 6 %
NEUTROS ABS: 10.1 10*3/uL — AB (ref 1.7–7.7)
Neutrophils Relative %: 73 %
PLATELETS: 273 10*3/uL (ref 150–400)
RBC: 2.58 MIL/uL — ABNORMAL LOW (ref 3.87–5.11)
RDW: 15.7 % — ABNORMAL HIGH (ref 11.5–15.5)
WBC: 13.8 10*3/uL — ABNORMAL HIGH (ref 4.0–10.5)

## 2017-08-20 LAB — BASIC METABOLIC PANEL
ANION GAP: 8 (ref 5–15)
BUN: 12 mg/dL (ref 6–20)
CALCIUM: 8.6 mg/dL — AB (ref 8.9–10.3)
CO2: 22 mmol/L (ref 22–32)
CREATININE: 0.96 mg/dL (ref 0.44–1.00)
Chloride: 106 mmol/L (ref 98–111)
Glucose, Bld: 149 mg/dL — ABNORMAL HIGH (ref 70–99)
Potassium: 3.8 mmol/L (ref 3.5–5.1)
SODIUM: 136 mmol/L (ref 135–145)

## 2017-08-20 LAB — PREPARE RBC (CROSSMATCH)

## 2017-08-20 LAB — I-STAT BETA HCG BLOOD, ED (MC, WL, AP ONLY)

## 2017-08-20 LAB — ABO/RH: ABO/RH(D): O POS

## 2017-08-20 LAB — HEMOGLOBIN AND HEMATOCRIT, BLOOD
HEMATOCRIT: 22.1 % — AB (ref 36.0–46.0)
Hemoglobin: 7.7 g/dL — ABNORMAL LOW (ref 12.0–15.0)

## 2017-08-20 MED ORDER — SODIUM CHLORIDE 0.9 % IV BOLUS
1000.0000 mL | Freq: Once | INTRAVENOUS | Status: AC
Start: 2017-08-20 — End: 2017-08-20
  Administered 2017-08-20: 1000 mL via INTRAVENOUS

## 2017-08-20 MED ORDER — ACETAMINOPHEN 650 MG RE SUPP: 650.0000 mg | Freq: Four times a day (QID) | RECTAL | Status: DC | PRN

## 2017-08-20 MED ORDER — ONDANSETRON HCL 4 MG/2ML IJ SOLN
4.0000 mg | Freq: Four times a day (QID) | INTRAMUSCULAR | Status: DC | PRN
  Administered 2017-08-20 (×2): 4 mg via INTRAVENOUS
  Filled 2017-08-20 (×2): qty 2

## 2017-08-20 MED ORDER — ESTROGENS CONJUGATED 25 MG IJ SOLR
25.0000 mg | Freq: Once | INTRAMUSCULAR | Status: AC
  Administered 2017-08-20: 25 mg via INTRAVENOUS
  Filled 2017-08-20: qty 25

## 2017-08-20 MED ORDER — ESTROGENS CONJUGATED 25 MG IJ SOLR
25.0000 mg | Freq: Once | INTRAMUSCULAR | Status: AC
Start: 2017-08-20 — End: 2017-08-20
  Administered 2017-08-20: 25 mg via INTRAVENOUS
  Filled 2017-08-20: qty 25

## 2017-08-20 MED ORDER — STERILE WATER FOR INJECTION IJ SOLN
INTRAMUSCULAR | Status: AC
  Administered 2017-08-20: 07:00:00
  Filled 2017-08-20: qty 10

## 2017-08-20 MED ORDER — LACTATED RINGERS IV SOLN
INTRAVENOUS | Status: AC
  Administered 2017-08-20 – 2017-08-21 (×2): via INTRAVENOUS

## 2017-08-20 MED ORDER — SODIUM CHLORIDE 0.9 % IV SOLN
10.0000 mL/h | Freq: Once | INTRAVENOUS | Status: AC
  Administered 2017-08-20: 10 mL/h via INTRAVENOUS

## 2017-08-20 MED ORDER — MEGESTROL ACETATE 40 MG PO TABS
120.0000 mg | ORAL_TABLET | Freq: Every day | ORAL | Status: DC
  Administered 2017-08-20 – 2017-08-22 (×3): 120 mg via ORAL
  Filled 2017-08-20 (×3): qty 3

## 2017-08-20 MED ORDER — ENSURE ENLIVE PO LIQD
237.0000 mL | Freq: Two times a day (BID) | ORAL | Status: DC
Start: 2017-08-21 — End: 2017-08-21
  Administered 2017-08-21: 237 mL via ORAL

## 2017-08-20 MED ORDER — ACETAMINOPHEN 325 MG PO TABS
650.0000 mg | ORAL_TABLET | Freq: Four times a day (QID) | ORAL | Status: DC | PRN
  Administered 2017-08-21: 650 mg via ORAL
  Filled 2017-08-20: qty 2

## 2017-08-20 NOTE — ED Triage Notes (Addendum)
Pt reports vaginal bleeding for several weeks. Reports tonight she had dizziness upon standing and started "seeing black and white, like a cartoon." Shortness of breath and chest pain starting yesterday, anxious in triage. Unable to hold still, continues to complain about being short of breath and wanting to lay flat. Pale, tongue/mucous membranes pale in color.

## 2017-08-20 NOTE — ED Notes (Signed)
Patient transported to Ultrasound 

## 2017-08-20 NOTE — Progress Notes (Signed)
Pt up to bathroom, with heavy bleeding, filled her pad, and to the shower, and Bathroom with passing blood clots with near syncope episode with vomiting. Nursing staff was called for assistance, and [facility] was called to assist with assessing patient and staring on the 2nd unit of blood.

## 2017-08-20 NOTE — H&P (Signed)
Date: 08/20/2017               Patient Name:  Frances Pearson MRN: [id number]  DOB: Aug 22, 1985 Age / Sex: 32 y.o., female   PCP: Medicine, [facility]         Medical Service: Internal Medicine Teaching Service         Attending Physician: Dr. Oval Linsey, MD    First Contact: Dr. Linna Hoff Pager: [phone]  Second Contact: Dr. Lorella Nimrod Pager: [phone]       After Hours (After 5p/  First Contact Pager: [phone]  weekends / holidays): Second Contact Pager: [phone]   Chief Complaint: Vaginal bleeding, chest pain, shortness of breath, dizziness,  History of Present Illness: Frances Pearson is 32 y/o F with no significant past medical history, presented to ED with increasing vaginal bleeding , shortness of breath and dizziness. She states that she has had vaginal bleeding and some suprabubic pain for the past 14 days which she consider as her normal period.   She is sexually active, has NEXPLANON implant for past year for contraception. Has had her mensturation every 2 months. Her last bleeding was about a month ago. She denies any bleeding before. How ever had a miscarriage 7 years ago. She has had one NVD and a healthy 32 y/o daughther.  In past 3 days, her bleeding got worse, had to change a pad every hour. This morning, she started to have dyspnea on exertion, lightheadedness and dizziness while standing. She experienced pre-syncope multiple time and then came to emergency department.  At emergency department, BP:94/70, PR:114, Hb:6.9 Hct:20.3. HCG:-, BUN:12, Cr:0.96. EKG was unremarkable. She received 2 unit of PRBC as well as IVF.   Meds:  Current Meds  Medication Sig  . etonogestrel (NEXPLANON) 68 MG IMPL implant 1 each by Subdermal route once. Left arm 08/2016  . ferrous sulfate 325 (65 FE) MG EC tablet Take 325 mg by mouth 2 (two) times daily.  . Multiple Vitamin (MULTIVITAMIN WITH MINERALS) TABS tablet Take 1 tablet by mouth daily.      Allergies: Allergies as of 08/20/2017 - Review Complete 08/20/2017  Allergen Reaction Noted  . Penicillins Hives 12/19/2012   Past Medical History:  Diagnosis Date  . Collapsed lung   . Iron deficiency anemia   . Vitamin D deficiency     Family History: Stroke in father                            Stroke in mother                            Fibroma in mother                            Blood cancer in cousin   Social History: Current smocker, 5-6 pack/year (8 siggarrete daily)  Drink alcohol occasionally Denies using drugs  Review of Systems: A complete ROS was negative except as per HPI.   Physical Exam: Blood pressure 104/65, pulse (!) 115, temperature 99.2 F (37.3 C), temperature source Oral, resp. rate 20, height 5\' 5"  (1.651 m), weight 167 lb (75.8 kg), last menstrual period 08/08/2017, SpO2 100 %, unknown if currently breastfeeding.   Physical Exam  Constitutional: She appears well-developed.  HENT: Conjunctiva is mildly pale Head: Normocephalic and atraumatic.  Cardiovascular: Regular rhythm. tachycardic  Soft II/VI systolic Murmur is heard at LSB. Pulmonary/Chest: Multiple scars are visible on back side of hear chest. Effort normal and breath sounds normal. No respiratory distress. She has no rales.  Abdominal: Soft. She exhibits no distension. No tenderness. There is no guarding.  Genitourinary: Vaginal discharge found.  Neurological: She is alert.  Skin: Skin is warm and dry. Capillary refill takes less than 2 seconds.    EKG: Sinus tachycardia, borderline T wave abnormality   Assessment & Plan by Problem: 1) Symptomatic anemia due to vaginal bleeding      Currently asymptomatic and hemodynaicaly stable s/p 2 unit of PRBC.     BP: 104/65     PR: 115 -Continue IVF -Monitor VS and Hb  2)Vaginal bleeding, Improved.  Trans abd. And trans vaginal US performed and reported fibroid change within the uterus as the etiology of bleeding.    -Premarin25 mg  IV  -Megestrol tab 120 mg QD -Follow up with Gynecology      Vitals:   08/20/17 1339 08/20/17 1347  BP:  104/65  Pulse:  (!) 115  Resp:  20  Temp:  99.2 F (37.3 C)  SpO2: 100%     Dispo: Admit patient to Observation with expected length of stay less than 2 midnights.  SignedDewayne Hatch, MD 08/20/2017, 1:49 PM  Pager: [phone]

## 2017-08-20 NOTE — Significant Event (Signed)
Rapid Response Event Note  Overview: Time Called: 6659 Arrival Time: 1325 Event Type: Hypotension  Initial Focused Assessment: Patient admitted with vaginal bleeding.  Per RN while up to bathroom patient became very dizzy and nauseated.  Per RN large amount of blood clots  Assisted back to bed. 125/79  HR 126  RR 20 O2 sat 100% on RA Upon my arrival patient lying in bed, she states she is gets dizzy and nauseated with ambulation.  She has also has frequent abdominal cramping.  BSC placed near bed. Transfuse 2nd unit of PRBC.  Interventions:  Plan of Care (if not transferred): RN to call if assistance needed.    Event Summary:   at      at    Outcome: Stayed in room and stabalized     Raliegh Ip

## 2017-08-20 NOTE — ED Provider Notes (Signed)
I spoke with the resident with the internal medicine teaching service who will admit the patient   Frances Johns, MD 08/20/17 [phone]

## 2017-08-20 NOTE — ED Provider Notes (Signed)
[place] EMERGENCY DEPARTMENT Provider Note   CSN: [id number] Arrival date & time: 08/20/17  0448     History   Chief Complaint Chief Complaint  Patient presents with  . Vaginal Bleeding    HPI Frances Pearson is a 32 y.o. female.  Patient is a 32 year old female with history of anemia presenting with complaints of vaginal bleeding.  She tells me that she has been on her period for the past 12 days.  Over the past several days she has passed blood clots.  This morning she began to feel dizzy upon standing and feels as though her heart is racing.  She does admit to some suprapubic pressure, however denies significant pain.  She denies any discharge.  Her last normal period she believes was last month.  She is sexually active with her husband and does not deny the possibility of pregnancy.     Past Medical History:  Diagnosis Date  . Collapsed lung   . Iron deficiency anemia   . Vitamin D deficiency     There are no active problems to display for this patient.   Past Surgical History:  Procedure Laterality Date  . CHEST TUBE INSERTION       OB History    Gravida  1   Para      Term      Preterm      AB      Living        SAB      TAB      Ectopic      Multiple      Live Births               Home Medications    Prior to Admission medications   Medication Sig Start Date End Date Taking? Authorizing Provider  B Complex-C (B-COMPLEX WITH VITAMIN C) tablet Take 1 tablet by mouth daily.    [provider]  metroNIDAZOLE (FLAGYL) 500 MG tablet Take 1 tablet (500 mg total) by mouth 2 (two) times daily. Patient not taking: Reported on 07/15/2017 02/28/17   Ward, Delice Bison, DO  nitrofurantoin, macrocrystal-monohydrate, (MACROBID) 100 MG capsule Take 1 capsule (100 mg total) by mouth 2 (two) times daily. Patient not taking: Reported on 02/28/2017 08/07/16   Okey Regal, PA-C    Family History History reviewed. No pertinent  family history.  Social History Social History   Tobacco Use  . Smoking status: Current Every Day Smoker    Packs/day: 0.50    Types: Cigarettes  . Smokeless tobacco: Never Used  Substance Use Topics  . Alcohol use: Yes    Comment: Binge drinking  . Drug use: Yes    Types: Marijuana     Allergies   Penicillins   Review of Systems Review of Systems  All other systems reviewed and are negative.    Physical Exam Updated Vital Signs BP (!) 118/101 (BP Location: Right Arm)   Pulse (!) 139   Temp 98.1 F (36.7 C) (Oral)   Resp (!) 24   Ht 5\' 5"  (1.651 m)   Wt 75.8 kg (167 lb)   LMP 08/08/2017 (Approximate)   SpO2 100%   BMI 27.79 kg/m   Physical Exam  Constitutional: She is oriented to person, place, and time. She appears well-developed and well-nourished. No distress.  HENT:  Head: Normocephalic and atraumatic.  Neck: Normal range of motion. Neck supple.  Cardiovascular: Regular rhythm. Exam reveals no gallop and no friction rub.  No murmur heard. Heart is tachycardic.  Pulmonary/Chest: Effort normal and breath sounds normal. No respiratory distress. She has no wheezes.  Abdominal: Soft. Bowel sounds are normal. She exhibits no distension. There is no tenderness.  Musculoskeletal: Normal range of motion.  Neurological: She is alert and oriented to person, place, and time.  Skin: Skin is warm and dry. She is not diaphoretic.  Nursing note and vitals reviewed.    ED Treatments / Results  Labs (all labs ordered are listed, but only abnormal results are displayed) Labs Reviewed  BASIC METABOLIC PANEL  CBC WITH DIFFERENTIAL/PLATELET  I-STAT BETA HCG BLOOD, ED ([facility], [facility], [facility] ONLY)  I-STAT BETA HCG BLOOD, ED ([facility], [facility], [facility] ONLY)  TYPE AND SCREEN    EKG EKG Interpretation  Date/Time:  Thursday August 20 2017 04:56:56 EDT Ventricular Rate:  123 PR Interval:    QRS Duration: 61 QT Interval:  294 QTC Calculation: 421 R Axis:   50 Text Interpretation:  Sinus  tachycardia Borderline T wave abnormalities Confirmed by Veryl Speak [phone]) on 08/20/2017 5:01:16 AM   Radiology No results found.  Procedures Procedures (including critical care time)  Medications Ordered in ED Medications  sodium chloride 0.9 % bolus 1,000 mL (has no administration in time range)     Initial Impression / Assessment and Plan / ED Course  I have reviewed the triage vital signs and the nursing notes.  Pertinent labs & imaging results that were available during my care of the patient were reviewed by me and considered in my medical decision making (see chart for details).  Patient presents with complaints of vaginal bleeding for the past 12 days.  She initially thought that this was her menstrual period, however has continued to worsen.  For the past 3 days, she has been passing clots and is now feeling lightheaded and dizzy when she stands and also feels her heart race.  Her work-up reveals a hemoglobin of 6.9, down from thirteen 1 month ago.  She is tachycardic and hypotensive.  I have discussed this patient with Dr. Elonda Husky from GYN who is recommending 25 mg IV Premarin, 120 mg p.o. megestrol, transfusion, and admission to the hospitalist service.  He also recommends receiving a second dose of Premarin in six hours.  Upon discharge, he recommends 120 mg megestrol daily for 5 days, then 80 mg daily for 5 days, then 40 mg daily until followed up by GYN.  Awaiting return of page from [place] for admission.  Final Clinical Impressions(s) / ED Diagnoses   Final diagnoses:  None    ED Discharge Orders    None       Veryl Speak, MD 08/20/17 [phone]

## 2017-08-20 NOTE — Progress Notes (Signed)
Pt called out indicated that she was bleeding again.  She had bleed though 2 pads, and out onto the bedding, leaving a spot approximately 1.5 feet in approximate diameter.

## 2017-08-21 DIAGNOSIS — Z842 Family history of other diseases of the genitourinary system: Secondary | ICD-10-CM | POA: Diagnosis not present

## 2017-08-21 DIAGNOSIS — Z8759 Personal history of other complications of pregnancy, childbirth and the puerperium: Secondary | ICD-10-CM | POA: Diagnosis not present

## 2017-08-21 DIAGNOSIS — N939 Abnormal uterine and vaginal bleeding, unspecified: Secondary | ICD-10-CM | POA: Diagnosis present

## 2017-08-21 DIAGNOSIS — N938 Other specified abnormal uterine and vaginal bleeding: Secondary | ICD-10-CM | POA: Diagnosis present

## 2017-08-21 DIAGNOSIS — Z880 Allergy status to penicillin: Secondary | ICD-10-CM | POA: Diagnosis not present

## 2017-08-21 DIAGNOSIS — D259 Leiomyoma of uterus, unspecified: Secondary | ICD-10-CM | POA: Diagnosis present

## 2017-08-21 DIAGNOSIS — D649 Anemia, unspecified: Secondary | ICD-10-CM | POA: Diagnosis not present

## 2017-08-21 DIAGNOSIS — I959 Hypotension, unspecified: Secondary | ICD-10-CM | POA: Diagnosis present

## 2017-08-21 DIAGNOSIS — D62 Acute posthemorrhagic anemia: Secondary | ICD-10-CM | POA: Diagnosis present

## 2017-08-21 DIAGNOSIS — D500 Iron deficiency anemia secondary to blood loss (chronic): Secondary | ICD-10-CM | POA: Diagnosis not present

## 2017-08-21 DIAGNOSIS — R000 Tachycardia, unspecified: Secondary | ICD-10-CM | POA: Diagnosis present

## 2017-08-21 DIAGNOSIS — Z793 Long term (current) use of hormonal contraceptives: Secondary | ICD-10-CM | POA: Diagnosis not present

## 2017-08-21 DIAGNOSIS — F17210 Nicotine dependence, cigarettes, uncomplicated: Secondary | ICD-10-CM | POA: Diagnosis present

## 2017-08-21 DIAGNOSIS — Z9889 Other specified postprocedural states: Secondary | ICD-10-CM | POA: Diagnosis not present

## 2017-08-21 LAB — CBC
HCT: 17.1 % — ABNORMAL LOW (ref 36.0–46.0)
HEMATOCRIT: 21.8 % — AB (ref 36.0–46.0)
HEMOGLOBIN: 7.9 g/dL — AB (ref 12.0–15.0)
Hemoglobin: 6.1 g/dL — CL (ref 12.0–15.0)
MCH: 29.5 pg (ref 26.0–34.0)
MCH: 31.1 pg (ref 26.0–34.0)
MCHC: 35.7 g/dL (ref 30.0–36.0)
MCHC: 36.2 g/dL — ABNORMAL HIGH (ref 30.0–36.0)
MCV: 82.6 fL (ref 78.0–100.0)
MCV: 85.8 fL (ref 78.0–100.0)
PLATELETS: 171 10*3/uL (ref 150–400)
PLATELETS: 162 10*3/uL (ref 150–400)
RBC: 2.07 MIL/uL — ABNORMAL LOW (ref 3.87–5.11)
RBC: 2.54 MIL/uL — AB (ref 3.87–5.11)
RDW: 15.1 % (ref 11.5–15.5)
RDW: 15.1 % (ref 11.5–15.5)
WBC: 14.5 10*3/uL — AB (ref 4.0–10.5)
WBC: 14 10*3/uL — AB (ref 4.0–10.5)

## 2017-08-21 LAB — IRON AND TIBC
IRON: 7 ug/dL — AB (ref 28–170)
SATURATION RATIOS: 3 % — AB (ref 10.4–31.8)
TIBC: 242 ug/dL — ABNORMAL LOW (ref 250–450)
UIBC: 235 ug/dL

## 2017-08-21 LAB — PREPARE RBC (CROSSMATCH)

## 2017-08-21 LAB — FERRITIN: Ferritin: 6 ng/mL — ABNORMAL LOW (ref 11–307)

## 2017-08-21 LAB — MRSA PCR SCREENING: MRSA by PCR: NEGATIVE

## 2017-08-21 MED ORDER — LACTATED RINGERS IV SOLN
INTRAVENOUS | Status: AC
  Administered 2017-08-21: 07:00:00 via INTRAVENOUS

## 2017-08-21 MED ORDER — DIPHENHYDRAMINE HCL 25 MG PO CAPS
25.0000 mg | ORAL_CAPSULE | Freq: Four times a day (QID) | ORAL | Status: DC | PRN
  Administered 2017-08-21: 25 mg via ORAL
  Filled 2017-08-21: qty 1

## 2017-08-21 MED ORDER — SODIUM CHLORIDE 0.9% IV SOLUTION
Freq: Once | INTRAVENOUS | Status: AC
  Administered 2017-08-21: 09:00:00 via INTRAVENOUS

## 2017-08-21 NOTE — Progress Notes (Signed)
CRITICAL VALUE ALERT  Critical Value:  Hgb 7.7  Date & Time Notied: 08/20/17  2246  Provider Notified:Dr Sherry Ruffing  Orders Received/Actions taken: Awaiting for reply

## 2017-08-21 NOTE — Progress Notes (Signed)
Frances Pearson [id number]  Code Status: FULL  Admission Data: 08/21/2017   Attending Provider: Eppie Gibson  XLK:[id number], [facility]  Consults/ Treatment Team:   Frances Pearson is a 32 y.o. female patient admitted from ED awake, alert - oriented X 4 - no acute distress noted. VSS - Blood pressure (!) 121/58, pulse (!) 101, temperature 98.5 F (36.9 C), temperature source Oral, resp. rate 16, height 5\' 5"  (1.651 m), weight 75.8 kg (167 lb), last menstrual period 08/08/2017, SpO2 100 %, not currently breastfeeding. no c/o shortness of breath, no c/o chest pain. PRBCS infusing at time of admission. Orientation to room, and floor completed with information packet given to patient/family. Admission INP armband ID verified with patient/family, and in place.  SR up x 2, fall assessment complete, with patient and family able to verbalize understanding of risk associated with falls, and verbalized understanding to call nsg before up out of bed. Call light within reach, patient able to voice, and demonstrate understanding. Skin, clean-dry- intact without evidence of bruising, or skin tears.  No evidence of skin break down noted on exam.  ?  Will cont to eval and treat per MD orders.  Melonie Florida, RN  08/21/2017 6:17 PM

## 2017-08-21 NOTE — Progress Notes (Addendum)
   Subjective: [name] is feeling better today. Her bleeding has been decreased. Does not report dizziness, headache or SOB. Has walked in heer room with no difficulty. Had some episode of vaginal bleeding and near syncope yesterday evening with Hb drop, which received 2 unit of PC.   Objective:  Vital signs in last 24 hours: Vitals:   08/21/17 1213 08/21/17 1314 08/21/17 1333 08/21/17 1620  BP: 122/65 131/73 (!) 128/56 (!) 121/58  Pulse: (!) 110 (!) 125 (!) 112 (!) 101  Resp: 16 16 15 16   Temp: 98.6 F (37 C) 98 F (36.7 C) 98.3 F (36.8 C) 98.5 F (36.9 C)  TempSrc: Oral Axillary Axillary Oral  SpO2: 100% 100% 100%   Weight:      Height:       Ph/E: Alert and oriented. In no acute distress. Cardiovascular: Normal rate, regular rhythm and normal heart sounds. No murmur heard. Pulmonary/Chest: Effort normal and breath sounds normal. No respiratory distress. She has no wheezes. She has no rales.  Abdominal: Soft. Bowel sounds are normal. She exhibits no distension. There is no tenderness. There is no guarding.  Genitourinary: Still some vaginal bleeding found.  Skin: No pallor.    Assessment/Plan:  Symptomatic anemia due to vaginal bleeding. s/p 2 unit PC x 2, s/p Premarin 25 mg IV x2 Currently asymptomatic and with decreased bleeding.  -Follow up with post transfusion Hb -Continue Megestrol 120 mg daily -If continious vaginal bleeding, will consult GYN -Transfer stepdown for staff nursing reason  Dispo: Anticipated discharge tomorrow if stay hemodynamically stable with controled vaginal bleeding   Dewayne Hatch, MD 08/21/2017, 5:16 PM Pager: @[phone]
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
## 2017-08-22 DIAGNOSIS — D649 Anemia, unspecified: Secondary | ICD-10-CM

## 2017-08-22 LAB — CBC
HEMATOCRIT: 20.3 % — AB (ref 36.0–46.0)
HEMOGLOBIN: 6.9 g/dL — AB (ref 12.0–15.0)
MCH: 28.8 pg (ref 26.0–34.0)
MCHC: 34 g/dL (ref 30.0–36.0)
MCV: 84.6 fL (ref 78.0–100.0)
Platelets: 145 10*3/uL — ABNORMAL LOW (ref 150–400)
RBC: 2.4 MIL/uL — ABNORMAL LOW (ref 3.87–5.11)
RDW: 14.8 % (ref 11.5–15.5)
WBC: 10.6 10*3/uL — ABNORMAL HIGH (ref 4.0–10.5)

## 2017-08-22 MED ORDER — MEGESTROL ACETATE 40 MG PO TABS
120.0000 mg | ORAL_TABLET | Freq: Every day | ORAL | 0 refills | Status: DC
Start: 2017-08-23 — End: 2017-09-29

## 2017-08-22 NOTE — Progress Notes (Signed)
CRITICAL VALUE ALERT  Critical Value: hemoglobin 6.9  Date & Time Notied: 08/22/2017 0820  Provider Notified: Dr. Myrtie Hawk   Orders Received/Actions taken:  IM team to come see patient

## 2017-08-22 NOTE — Progress Notes (Signed)
   Subjective: Ms. Quraishi is feeling great today. Has no vaginal bleeding since yesterday. No chest pain, dizziness or SOB. I updated her about her new Hb number (6.9), and the possibility of getting another Packed RBC before discharge to home. She prefers not to receive another transfusion though.  Objective:  Vital signs in last 24 hours: Vitals:   08/21/17 2325 08/22/17 0002 08/22/17 0325 08/22/17 0817  BP: 123/74  121/61   Pulse: 99  93   Resp: 18  17   Temp:  98.4 F (36.9 C)  98.4 F (36.9 C)  TempSrc:  Oral    SpO2: 100%  100%   Weight:      Height:       Ph/E: General: Alert and oriented, in no acute distress. Head and neck: No conjunctival pallor Cardiac: RRR, no murmur is heard Lungs: CTA bilaterally with normal breath sounds Extremities: Normal capillary filling andWith normal peripheral pulses, skin is warm   Assessment/Plan:  1)Symptomatic anemia 2/2 uterine bleeding:  Improved. Currently asymptomatic.s/p 4 units of PRBC.  her Hb today came back 6.9 but she prefers not receive another Pcell for this border line Hb, since has been asymptomatic and her bleeding has stopped. Will follow up with her GYN in 12 days.  -D/C with Iron supplement and f/u with PCP for her chronic IDA  2)Vaginal bleeding: No bleeding since yesterday.   -D/C today with PO megestrol as  bellow:  120 mg QD for 2 more days Then 80 mg QD for 5 days Then 40 mg daily till she sees her GYN doctor  -Follow up with her GYN physician on July 18.    Dispo: Anticipated discharge: Today  Dewayne Hatch, MD 08/22/2017, 10:05 AM [id number]

## 2017-08-22 NOTE — Progress Notes (Signed)
Nsg Discharge Note  Admit Date:  08/20/2017 Discharge date: 08/22/2017   Frances Pearson to be D/C'd Home per MD order.  AVS completed.  Copy for chart, and copy for patient signed, and dated. Patient/caregiver able to verbalize understanding.  Discharge Medication: Allergies as of 08/22/2017      Reactions   Penicillins Hives   *okay with AMOXICILLIN*  Has patient had a PCN reaction causing immediate rash, facial/tongue/throat swelling, SOB or lightheadedness with hypotension: unknown Has patient had a PCN reaction causing severe rash involving mucus membranes or skin necrosis: unknown Has patient had a PCN reaction that required hospitalization: No  Has patient had a PCN reaction occurring within the last 10 years: Yes  If all of the above answers are "NO", then may proceed with Cephalosporin use.      Medication List    TAKE these medications   etonogestrel 68 MG Impl implant Commonly known as:  NEXPLANON 1 each by Subdermal route once. Left arm 08/2016   ferrous sulfate 325 (65 FE) MG EC tablet Take 325 mg by mouth 2 (two) times daily.   megestrol 40 MG tablet Commonly known as:  MEGACE Take 3 tablets (120 mg total) by mouth daily. For 2 more days, then take 2 tablets daily for another 5 days, then take 1 tablet daily till you see your gynecologist. Start taking on:  08/23/2017   multivitamin with minerals Tabs tablet Take 1 tablet by mouth daily.       Discharge Assessment: Vitals:   08/22/17 0325 08/22/17 0817  BP: 121/61   Pulse: 93   Resp: 17   Temp:  98.4 F (36.9 C)  SpO2: 100%    Skin clean, dry and intact without evidence of skin break down, no evidence of skin tears noted. IV catheter discontinued intact. Site without signs and symptoms of complications - no redness or edema noted at insertion site, patient denies c/o pain - only slight tenderness at site.  Dressing with slight pressure applied.  D/c Instructions-Education: Discharge instructions given to  patient/family with verbalized understanding. D/c education completed with patient/family including follow up instructions, medication list, d/c activities limitations if indicated, with other d/c instructions as indicated by MD - patient able to verbalize understanding, all questions fully answered. Patient instructed to return to ED, call 911, or call MD for any changes in condition.  Patient escorted via [place], and D/C home via private auto.  Frances N Chelsee Hosie, RN 08/22/2017 1:02 PM

## 2017-08-22 NOTE — Discharge Summary (Signed)
Name: Frances Pearson MRN: [id number] DOB: December 12, 1985 32 y.o. PCP: Medicine, [facility]  Date of Admission: 08/20/2017  4:48 AM Date of Discharge:  Attending Physician: Oval Linsey, MD  Discharge Diagnosis: 1. Symptomatic anemia secondary to uterine bleeding  Discharge Medications: Allergies as of 08/22/2017      Reactions   Penicillins Hives   *okay with AMOXICILLIN*  Has patient had a PCN reaction causing immediate rash, facial/tongue/throat swelling, SOB or lightheadedness with hypotension: unknown Has patient had a PCN reaction causing severe rash involving mucus membranes or skin necrosis: unknown Has patient had a PCN reaction that required hospitalization: No  Has patient had a PCN reaction occurring within the last 10 years: Yes  If all of the above answers are "NO", then may proceed with Cephalosporin use.      Medication List    TAKE these medications   etonogestrel 68 MG Impl implant Commonly known as:  NEXPLANON 1 each by Subdermal route once. Left arm 08/2016   ferrous sulfate 325 (65 FE) MG EC tablet Take 325 mg by mouth 2 (two) times daily.   megestrol 40 MG tablet Commonly known as:  MEGACE Take 3 tablets (120 mg total) by mouth daily. For 2 more days, then take 2 tablets daily for another 5 days, then take 1 tablet daily till you see your gynecologist. Start taking on:  08/23/2017   multivitamin with minerals Tabs tablet Take 1 tablet by mouth daily.       Disposition and follow-up:   [name] was discharged from [facility] in Stable condition.  At the hospital follow up visit please address:  1.  Patient did not have any more vaginal bleeding  2.  Labs / imaging needed at time of follow-up: CBC  3.  Pending labs/ test needing follow-up: None  Follow-up Appointments: Has appointment with her Gynecologist at 35 th of this month (July 2019)   Hospital Course by problem list: 1. Symptomatic anemia secondary to  uterine bleeding Frances Pearson is a 32 y/o female with Hx.of chronic iron deficiency anemia pesented to ED with worsening vaginal bleeding, dizziness and shortness of breath. Vaginal bleeding started14 days before addmission which she consider as her normal period, then worsened in last 3 days and she ended up having shortness of breath and presyncope. She addmitted at ED to be evaluated and treat for symptomatic anemia. Initially she had HB 6.9, received IV Premarin 25 mg twice, and PO Megestrol 120 mg QD for 3 days of hospitalization, and 4 units of PRBC over 2 days. With increase of Hb to 7.9 then dropped to 6.9 with few episode of vaginal bleeding episodes. Her vaginal bleeding stopped and vitals sign stable.   Having the borderline number of Hb 6.9, we explained to patient the option of another transfusion, she how ever, prefered not to get another unit of blood since has had no more vaginal bleeding ,dizziness and SOB in past 1 day. She discharged with Megestrol PO and iron supplement and has an appointment with her gynecologist in 12 days.    Discharge Vitals:   BP 121/61   Pulse 93   Temp 98.4 F (36.9 C)   Resp 17   Ht 5\' 5"  (1.651 m)   Wt 167 lb (75.8 kg)   LMP 08/08/2017 (Approximate)   SpO2 100%   Breastfeeding? No   BMI 27.79 kg/m   Pertinent Labs, Studies, and Procedures:    08/20/17 0502  WBC 13.8*  NEUTROABS 10.1*  HGB 6.9*  HCT 20.3*  MCV 78.7  PLT  B-HCG 273  Negative    ECG: 08/20/2017 Unremarkable  TRANSABDOMINAL AND TRANSVAGINAL ULTRASOUND OF PELVIS 08/20/2017  -Uterus Measurements: 12.0 x 6.4 x 7.4 cm.  -Diffuse heterogeneity is noted consistent with underlying fibroid change. The largest of these measures 2.2 cm anteriorly near the fundus. Endometrium Thickness: 7.7 mm.  -No focal abnormality visualized.  -Right ovary Measurements: 5.3 x 4.7 x 3.8 cm. Multiple follicular cysts are noted. The largest of these measures 2.8 cm in dimension.   Minimal amount of  adjacent free fluid is noted. Left ovary Measurements: 4.7 x 2.2 x 2.0 cm. Normal appearance/no adnexal mass. Other findings Minimal free fluid is noted adjacent to the right ovary.    IMPRESSION: Fibroid change within the uterus. This is likely the etiology of the patient's underlying clinical history. No other acute abnormality is noted.   Discharge Instructions: Discharge Instructions    Call MD for:  persistant dizziness or light-headedness   Complete by:  As directed    Diet - low sodium heart healthy   Complete by:  As directed    Discharge instructions   Complete by:  As directed    We are glad that you are doing well now. We are sending home on this new medication called Megestrol, you should take 3 tablets (120 mg) daily for 2 more days, then take 2 tablets daily for another 5 days, then take 1 tablet daily till you see your gynecologist. Please see you gynecologist according your scheduled appointment. Keep yourself well hydrated and take your iron supplement daily.   Increase activity slowly   Complete by:  As directed       Signed: Dewayne Hatch, MD 08/22/2017, 11:19 AM   Pager: [phone]

## 2017-08-22 NOTE — Progress Notes (Signed)
CSW was consulted by RN Niger for transportation needs for pt.  CSW gave RN Niger a taxi voucher for disposition.  No further needs noted.  CSW signing off.

## 2017-08-24 LAB — TYPE AND SCREEN
ABO/RH(D): O POS
Antibody Screen: NEGATIVE
Unit division: 0
Unit division: 0
Unit division: 0
Unit division: 0
Unit division: 0
Unit division: 0

## 2017-08-24 LAB — BPAM RBC
BLOOD PRODUCT EXPIRATION DATE: 201908022359
BLOOD PRODUCT EXPIRATION DATE: 201908062359
Blood Product Expiration Date: 201908052359
Blood Product Expiration Date: 201908052359
Blood Product Expiration Date: 201908062359
Blood Product Expiration Date: 201908062359
ISSUE DATE / TIME: 201907040657
ISSUE DATE / TIME: 201907041338
ISSUE DATE / TIME: 201907050855
ISSUE DATE / TIME: 201907051305
UNIT TYPE AND RH: 5100
Unit Type and Rh: 5100
Unit Type and Rh: 5100
Unit Type and Rh: 5100
Unit Type and Rh: 5100
Unit Type and Rh: 5100

## 2017-09-16 ENCOUNTER — Ambulatory Visit
Admission: RE | Admit: 2017-09-16 | Discharge: 2017-09-16 | Disposition: A | Discharge status: Home / Self Care | Payer: Medicaid Other | Source: Ambulatory Visit | Attending: Internal Medicine | Admitting: Internal Medicine

## 2017-09-16 DIAGNOSIS — N6320 Unspecified lump in the left breast, unspecified quadrant: Secondary | ICD-10-CM

## 2017-09-29 ENCOUNTER — Ambulatory Visit: Payer: Medicaid Other | Admitting: Obstetrics & Gynecology

## 2017-09-29 ENCOUNTER — Encounter: Payer: Self-pay | Admitting: Obstetrics & Gynecology

## 2017-09-29 VITALS — BP 111/75 | HR 78 | Ht 65.0 in | Wt 166.0 lb

## 2017-09-29 DIAGNOSIS — Z87440 Personal history of urinary (tract) infections: Secondary | ICD-10-CM

## 2017-09-29 DIAGNOSIS — N939 Abnormal uterine and vaginal bleeding, unspecified: Secondary | ICD-10-CM

## 2017-09-29 DIAGNOSIS — D649 Anemia, unspecified: Secondary | ICD-10-CM | POA: Diagnosis not present

## 2017-09-29 DIAGNOSIS — D219 Benign neoplasm of connective and other soft tissue, unspecified: Secondary | ICD-10-CM

## 2017-09-29 MED ORDER — DROSPIRENONE-ETHINYL ESTRADIOL 3-0.03 MG PO TABS: 1.0000 | ORAL_TABLET | Freq: Every day | ORAL | 11 refills | Status: DC
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
## 2017-09-29 NOTE — Patient Instructions (Signed)
Oral Contraception Information Oral contraceptive pills (OCPs) are medicines taken to prevent pregnancy. OCPs work by preventing the ovaries from releasing eggs. The hormones in OCPs also cause the cervical mucus to thicken, preventing the sperm from entering the uterus. The hormones also cause the uterine lining to become thin, not allowing a fertilized egg to attach to the inside of the uterus. OCPs are highly effective when taken exactly as prescribed. However, OCPs do not prevent sexually transmitted diseases (STDs). Safe sex practices, such as using condoms along with the pill, can help prevent STDs. Before taking the pill, you may have a physical exam and Pap test. Your health care provider may order blood tests. The health care provider will make sure you are a good candidate for oral contraception. Discuss with your health care provider the possible side effects of the OCP you may be prescribed. When starting an OCP, it can take 2 to 3 months for the body to adjust to the changes in hormone levels in your body. Types of oral contraception  The combination pill-This pill contains estrogen and progestin (synthetic progesterone) hormones. The combination pill comes in 21-day, 28-day, or 91-day packs. Some types of combination pills are meant to be taken continuously (365-day pills). With 21-day packs, you do not take pills for 7 days after the last pill. With 28-day packs, the pill is taken every day. The last 7 pills are without hormones. Certain types of pills have more than 21 hormone-containing pills. With 91-day packs, the first 84 pills contain both hormones, and the last 7 pills contain no hormones or contain estrogen only.  The minipill-This pill contains the progesterone hormone only. The pill is taken every day continuously. It is very important to take the pill at the same time each day. The minipill comes in packs of 28 pills. All 28 pills contain the hormone. Advantages of oral  contraceptive pills  Decreases premenstrual symptoms.  Treats menstrual period cramps.  Regulates the menstrual cycle.  Decreases a heavy menstrual flow.  May treatacne, depending on the type of pill.  Treats abnormal uterine bleeding.  Treats polycystic ovarian syndrome.  Treats endometriosis.  Can be used as emergency contraception. Things that can make oral contraceptive pills less effective OCPs can be less effective if:  You forget to take the pill at the same time every day.  You have a stomach or intestinal disease that lessens the absorption of the pill.  You take OCPs with other medicines that make OCPs less effective, such as antibiotics, certain HIV medicines, and some seizure medicines.  You take expired OCPs.  You forget to restart the pill on day 7, when using the packs of 21 pills.  Risks associated with oral contraceptive pills Oral contraceptive pills can sometimes cause side effects, such as:  Headache.  Nausea.  Breast tenderness.  Irregular bleeding or spotting.  Combination pills are also associated with a small increased risk of:  Blood clots.  Heart attack.  Stroke.  This information is not intended to replace advice given to you by your health care provider. Make sure you discuss any questions you have with your health care provider. Document Released: 04/26/2002 Document Revised: 07/12/2015 Document Reviewed: 07/25/2012 Elsevier Interactive Patient Education  2018 [employer] Inc[employer]  

## 2017-10-01 LAB — URINE CULTURE

## 2017-10-14 ENCOUNTER — Ambulatory Visit: Payer: Medicaid Other | Admitting: Medical

## 2017-10-14 ENCOUNTER — Encounter: Payer: Self-pay | Admitting: Medical

## 2017-10-14 ENCOUNTER — Other Ambulatory Visit (HOSPITAL_COMMUNITY)
Admission: RE | Admit: 2017-10-14 | Discharge: 2017-10-14 | Disposition: A | Discharge status: Home / Self Care | Payer: Medicaid Other | Source: Ambulatory Visit | Attending: Medical | Admitting: Medical

## 2017-10-14 VITALS — BP 118/64 | HR 86 | Ht 65.0 in | Wt 165.2 lb

## 2017-10-14 DIAGNOSIS — N898 Other specified noninflammatory disorders of vagina: Secondary | ICD-10-CM | POA: Insufficient documentation

## 2017-10-14 DIAGNOSIS — Z01419 Encounter for gynecological examination (general) (routine) without abnormal findings: Secondary | ICD-10-CM | POA: Insufficient documentation

## 2017-10-14 DIAGNOSIS — Z0000 Encounter for general adult medical examination without abnormal findings: Secondary | ICD-10-CM

## 2017-10-14 NOTE — Progress Notes (Signed)
Pt complains of having a odor with her discharge. She also states that she does not want to have her nexplanon removed today.
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
## 2017-10-14 NOTE — Patient Instructions (Signed)

## 2017-10-16 LAB — CERVICOVAGINAL ANCILLARY ONLY
BACTERIAL VAGINITIS: NEGATIVE
Candida vaginitis: NEGATIVE
Chlamydia: NEGATIVE
Neisseria Gonorrhea: NEGATIVE
Trichomonas: NEGATIVE

## 2017-10-20 LAB — CYTOLOGY - PAP
DIAGNOSIS: NEGATIVE
HPV (WINDOPATH): NOT DETECTED

## 2018-08-08 ENCOUNTER — Emergency Department (HOSPITAL_COMMUNITY)
Admission: EM | Admit: 2018-08-08 | Discharge: 2018-08-08 | Disposition: A | Discharge status: Home / Self Care | Payer: Medicaid Other | Attending: Emergency Medicine | Admitting: Emergency Medicine

## 2018-08-08 ENCOUNTER — Other Ambulatory Visit: Payer: Self-pay

## 2018-08-08 DIAGNOSIS — F1210 Cannabis abuse, uncomplicated: Secondary | ICD-10-CM | POA: Diagnosis not present

## 2018-08-08 DIAGNOSIS — Z79899 Other long term (current) drug therapy: Secondary | ICD-10-CM | POA: Diagnosis not present

## 2018-08-08 DIAGNOSIS — R531 Weakness: Secondary | ICD-10-CM | POA: Diagnosis present

## 2018-08-08 DIAGNOSIS — F17210 Nicotine dependence, cigarettes, uncomplicated: Secondary | ICD-10-CM | POA: Diagnosis not present

## 2018-08-08 LAB — URINALYSIS, ROUTINE W REFLEX MICROSCOPIC
Bacteria, UA: NONE SEEN
Bilirubin Urine: NEGATIVE
Glucose, UA: NEGATIVE mg/dL
Ketones, ur: 5 mg/dL — AB
Nitrite: NEGATIVE
Protein, ur: 100 mg/dL — AB
RBC / HPF: >50 RBC/hpf — ABNORMAL HIGH (ref 0–5)
Specific Gravity, Urine: 1.031 — ABNORMAL HIGH (ref 1.005–1.030)
pH: 6 (ref 5.0–8.0)

## 2018-08-08 LAB — BASIC METABOLIC PANEL
Anion gap: 6 (ref 5–15)
BUN: 7 mg/dL (ref 6–20)
CO2: 22 mmol/L (ref 22–32)
Calcium: 9.3 mg/dL (ref 8.9–10.3)
Chloride: 108 mmol/L (ref 98–111)
Creatinine, Ser: 0.83 mg/dL (ref 0.44–1.00)
GFR calc Af Amer: >60 mL/min (ref >60)
GFR calc non Af Amer: >60 mL/min (ref >60)
Glucose, Bld: 110 mg/dL — ABNORMAL HIGH (ref 70–99)
Potassium: 3.7 mmol/L (ref 3.5–5.1)
Sodium: 136 mmol/L (ref 135–145)

## 2018-08-08 LAB — CBC
HCT: 37.7 % (ref 36.0–46.0)
Hemoglobin: 13.2 g/dL (ref 12.0–15.0)
MCH: 27.4 pg (ref 26.0–34.0)
MCHC: 35 g/dL (ref 30.0–36.0)
MCV: 78.4 fL — ABNORMAL LOW (ref 80.0–100.0)
Platelets: 290 10*3/uL (ref 150–400)
RBC: 4.81 MIL/uL (ref 3.87–5.11)
RDW: 13.7 % (ref 11.5–15.5)
WBC: 6.3 10*3/uL (ref 4.0–10.5)
nRBC: 0 % (ref 0.0–0.2)

## 2018-08-08 LAB — HEPATIC FUNCTION PANEL
ALT: 16 U/L (ref 0–44)
AST: 21 U/L (ref 15–41)
Albumin: 3.9 g/dL (ref 3.5–5.0)
Alkaline Phosphatase: 36 U/L — ABNORMAL LOW (ref 38–126)
Bilirubin, Direct: 0.1 mg/dL (ref 0.0–0.2)
Indirect Bilirubin: 0.5 mg/dL (ref 0.3–0.9)
Total Bilirubin: 0.6 mg/dL (ref 0.3–1.2)
Total Protein: 7.2 g/dL (ref 6.5–8.1)

## 2018-08-08 LAB — LIPASE, BLOOD: Lipase: 34 U/L (ref 11–51)

## 2018-08-08 LAB — POC URINE PREG, ED: Preg Test, Ur: NEGATIVE

## 2018-08-08 NOTE — ED Triage Notes (Signed)
Pt reports feeling weak and fatigued x 5 days. Pt reports abdominal pain last week that has resolved. Pt reports dizziness, nausea, decreased appetite and diarrhea.

## 2018-08-08 NOTE — ED Notes (Signed)
Patient verbalizes understanding of discharge instructions. Opportunity for questioning and answers were provided. Armband removed by staff, pt discharged from ED.  
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
## 2019-05-27 ENCOUNTER — Other Ambulatory Visit: Payer: Self-pay | Admitting: Family Medicine

## 2019-05-27 DIAGNOSIS — D241 Benign neoplasm of right breast: Secondary | ICD-10-CM

## 2019-06-15 ENCOUNTER — Other Ambulatory Visit: Payer: Self-pay | Admitting: Family Medicine

## 2019-06-15 DIAGNOSIS — D242 Benign neoplasm of left breast: Secondary | ICD-10-CM

## 2019-06-20 ENCOUNTER — Other Ambulatory Visit: Payer: Medicaid Other

## 2019-08-03 ENCOUNTER — Ambulatory Visit
Admission: RE | Admit: 2019-08-03 | Discharge: 2019-08-03 | Disposition: A | Discharge status: Home / Self Care | Payer: Medicaid Other | Source: Ambulatory Visit | Attending: Family Medicine | Admitting: Family Medicine

## 2019-08-03 ENCOUNTER — Other Ambulatory Visit: Payer: Self-pay

## 2019-08-03 DIAGNOSIS — D242 Benign neoplasm of left breast: Secondary | ICD-10-CM

## 2020-01-03 ENCOUNTER — Other Ambulatory Visit (HOSPITAL_COMMUNITY)
Admission: RE | Admit: 2020-01-03 | Discharge: 2020-01-03 | Disposition: A | Discharge status: Home / Self Care | Payer: Medicaid Other | Source: Ambulatory Visit | Attending: Obstetrics and Gynecology | Admitting: Obstetrics and Gynecology

## 2020-01-03 ENCOUNTER — Other Ambulatory Visit: Payer: Self-pay

## 2020-01-03 ENCOUNTER — Encounter: Payer: Self-pay | Admitting: Obstetrics and Gynecology

## 2020-01-03 ENCOUNTER — Ambulatory Visit (INDEPENDENT_AMBULATORY_CARE_PROVIDER_SITE_OTHER): Payer: Medicaid Other | Admitting: Obstetrics and Gynecology

## 2020-01-03 VITALS — BP 117/77 | HR 103 | Ht 65.0 in | Wt 183.0 lb

## 2020-01-03 DIAGNOSIS — N898 Other specified noninflammatory disorders of vagina: Secondary | ICD-10-CM | POA: Diagnosis not present

## 2020-01-03 DIAGNOSIS — D219 Benign neoplasm of connective and other soft tissue, unspecified: Secondary | ICD-10-CM

## 2020-01-03 DIAGNOSIS — N921 Excessive and frequent menstruation with irregular cycle: Secondary | ICD-10-CM

## 2020-01-03 HISTORY — DX: Excessive and frequent menstruation with irregular cycle: N92.1

## 2020-01-03 NOTE — Progress Notes (Signed)
   Subjective:    Patient ID: [facility], female    DOB: Nov 11, 1985, 34 y.o.   MRN: [id number]  Patient presents c/o heavy, irregular bleeding a few months ago.  The patient states that she bled for 1.5 months.  Currently, she is without complaints.  Her last menses, which was at the end of October was normal.  The patient has a history of abnormal uterine bleeding with anemia, requiring transfusion of blood.  The patient also has a history of uterine fibroids.  She is sexually active and currently attempting pregnancy.       Review of Systems  Constitutional: Negative for chills, fatigue and fever.  Eyes: Negative for visual disturbance.  Respiratory: Negative for chest tightness and shortness of breath.   Cardiovascular: Negative for chest pain.  Gastrointestinal: Negative for abdominal pain, constipation, diarrhea, nausea and vomiting.  Endocrine: Negative for cold intolerance and heat intolerance.  Genitourinary: Negative for dyspareunia, dysuria, pelvic pain, urgency, vaginal discharge and vaginal pain.  Musculoskeletal: Negative for joint swelling.  Skin: Negative for rash.  Neurological: Negative for syncope and light-headedness.  Hematological: Negative for adenopathy. Does not bruise/bleed easily.  Psychiatric/Behavioral: Negative for dysphoric mood and sleep disturbance.       Objective:   Physical Exam Exam conducted with a chaperone present.  Constitutional:      Appearance: Normal appearance.  HENT:     Head: Normocephalic and atraumatic.     Mouth/Throat:     Mouth: Mucous membranes are moist.     Pharynx: Oropharynx is clear.  Eyes:     Conjunctiva/sclera: Conjunctivae normal.  Cardiovascular:     Rate and Rhythm: Normal rate and regular rhythm.  Pulmonary:     Breath sounds: Normal breath sounds.  Abdominal:     General: Abdomen is flat. Bowel sounds are normal. There is no distension.     Palpations: Abdomen is soft.     Tenderness: There is no abdominal  tenderness.  Genitourinary:    General: Normal vulva.     Vagina: Vaginal discharge and tenderness present.     Cervix: Cervical motion tenderness present.     Uterus: Enlarged. Not tender.      Adnexa:        Right: No mass or tenderness.         Left: No mass or tenderness.       Comments: Vagina:  Scant, white discharge, mild tenderness Cervix:  Mild cervical motion tenderness Uterus:  Slightly enlarged, difficult to completely assess secondary to habitus. Adnexa:  Difficult to completely assess secondary to habitus.   Neurological:     Mental Status: She is alert.           Assessment & Plan:   Encounter Diagnoses  Name Primary?  . Menometrorrhagia Yes  . Fibroids     - We will check a CBC and TSH. - Pelvic Ultrasound ordered. - Genital cultures collected. - Patient to keep a menstrual calendar. - She will fu in 4 weeks.

## 2020-01-03 NOTE — Progress Notes (Signed)
GYN presents for AUB which occurred 3 months ago when she bled 1.5 months. They have been regular since.  Last PAP 08/2//2019

## 2020-01-03 NOTE — Patient Instructions (Signed)
Metrorrhagia Metrorrhagia is bleeding from the womb (uterus) that is not normal. The bleeding usually happens between periods. It happens often. Follow these instructions at home: Watch your symptoms for any changes. Tell your doctor about them. Follow these instructions to help with your condition: Eating and drinking   Eat meals that have a lot of the nutrients that your body needs (are well-balanced).  Eat foods that are high in iron. Some foods with iron are: ? Liver. ? Meat. ? Shellfish. ? Green leafy vegetables. ? Eggs.  If you have trouble pooping (constipation): ? Drink plenty of water. Drink enough to keep your pee (urine) pale yellow. ? Take over-the-counter or prescription medicines. ? Eat foods that are high in fiber. These include beans, whole grains, and fresh fruits and vegetables. ? Limit foods that are high in fat and sugar. These include fried or sweet foods. Medicines  Take over-the-counter and prescription medicines only as told by your doctor.  Do not change medicines without talking with your doctor.  Do not take aspirin or medicines that have aspirin: ? During your period. ? During the week before your period.  If you were prescribed iron pills, take them as told by your doctor. Activity  If you need to change your pad or tampon more than one time in 2 hours: ? Lie in bed with your feet raised (elevated). ? Put a cold pack on your lower belly (abdomen). ? Rest as much as you can until the bleeding stops or slows down. General instructions   For 2 months, write down: ? When your period starts. ? When your period ends. ? When you have bleeding that happens outside your period. ? What problems you notice.  Keep all follow-up visits as told by your doctor. This is important. Contact a doctor if:  You feel light-headed.  You feel weak.  You feel sick to your stomach (nauseous).  You throw up (vomit).  You cannot eat or drink without throwing  up.  You feel dizzy while using medicine.  You have watery poop (diarrhea) while using medicine.  You have questions about birth control. Get help right away if:  You have a fever.  You have chills.  You need to change your pad or tampon more than one time in an hour.  You have more bleeding than before.  Your blood has clumps (clots) in it.  You have pain in your belly.  You pass out (lose consciousness).  You have a rash. Summary  Metrorrhagia is bleeding from the womb (uterus) that happens between periods. It happens often.  Watch your symptoms for any changes. Tell your doctor about them.  Eat meals that have a lot of nutrients. Make sure you eat foods that are high in iron. These include liver, meat, shellfish, green vegetables, and eggs.  Get help right away if you have a fever or a rash, you see clots in your blood, or you have more bleeding than before. This information is not intended to replace advice given to you by your health care provider. Make sure you discuss any questions you have with your health care provider. Document Revised: 08/06/2017 Document Reviewed: 08/06/2017 Elsevier Patient Education  2020 [employer].

## 2020-01-04 LAB — CERVICOVAGINAL ANCILLARY ONLY
Bacterial Vaginitis (gardnerella): NEGATIVE
Candida Glabrata: NEGATIVE
Candida Vaginitis: NEGATIVE
Chlamydia: NEGATIVE
Comment: NEGATIVE
Comment: NEGATIVE
Comment: NEGATIVE
Comment: NEGATIVE
Comment: NEGATIVE
Comment: NORMAL
Neisseria Gonorrhea: NEGATIVE
Trichomonas: NEGATIVE

## 2020-01-04 LAB — CBC
Hematocrit: 43.1 % (ref 34.0–46.6)
Hemoglobin: 14.2 g/dL (ref 11.1–15.9)
MCH: 28 pg (ref 26.6–33.0)
MCHC: 32.9 g/dL (ref 31.5–35.7)
MCV: 85 fL (ref 79–97)
Platelets: 278 10*3/uL (ref 150–450)
RBC: 5.07 x10E6/uL (ref 3.77–5.28)
RDW: 15.4 % (ref 11.7–15.4)
WBC: 5.1 10*3/uL (ref 3.4–10.8)

## 2020-01-04 LAB — TSH: TSH: 2.16 u[IU]/mL (ref 0.450–4.500)

## 2020-01-19 ENCOUNTER — Ambulatory Visit
Admission: RE | Admit: 2020-01-19 | Discharge: 2020-01-19 | Disposition: A | Discharge status: Home / Self Care | Payer: Medicaid Other | Source: Ambulatory Visit | Attending: Obstetrics and Gynecology | Admitting: Obstetrics and Gynecology

## 2020-01-19 ENCOUNTER — Other Ambulatory Visit: Payer: Self-pay

## 2020-01-19 DIAGNOSIS — D219 Benign neoplasm of connective and other soft tissue, unspecified: Secondary | ICD-10-CM

## 2020-01-19 DIAGNOSIS — N921 Excessive and frequent menstruation with irregular cycle: Secondary | ICD-10-CM | POA: Insufficient documentation

## 2020-01-31 ENCOUNTER — Ambulatory Visit (INDEPENDENT_AMBULATORY_CARE_PROVIDER_SITE_OTHER): Payer: Medicaid Other | Admitting: Obstetrics and Gynecology

## 2020-01-31 ENCOUNTER — Other Ambulatory Visit: Payer: Self-pay

## 2020-01-31 ENCOUNTER — Encounter: Payer: Self-pay | Admitting: Obstetrics and Gynecology

## 2020-01-31 VITALS — BP 138/78 | HR 101 | Wt 184.0 lb

## 2020-01-31 DIAGNOSIS — D219 Benign neoplasm of connective and other soft tissue, unspecified: Secondary | ICD-10-CM

## 2020-01-31 DIAGNOSIS — N921 Excessive and frequent menstruation with irregular cycle: Secondary | ICD-10-CM

## 2020-01-31 NOTE — Progress Notes (Signed)
   Subjective:    Patient ID: [facility], female    DOB: 09-13-1985, 34 y.o.   MRN: [id number]  Patient presents for fu of one episode of an irregular cycle.  The last 2 cycles have been normal.  Her recent CBC and TSH were normal.  The recent pelvic ultrasound showed a small 1.8 cm intramural fibroid.  Currently, the patient is without complaints.       Review of Systems  Constitutional: Negative for chills and fever.  Gastrointestinal: Negative for abdominal pain, constipation, diarrhea, nausea and vomiting.  Genitourinary: Negative for dyspareunia, dysuria, flank pain, pelvic pain, urgency and vaginal discharge.       Objective:   Physical Exam Constitutional:      Appearance: Normal appearance.  Abdominal:     General: Abdomen is flat.     Palpations: Abdomen is soft.  Neurological:     Mental Status: She is alert.           Assessment & Plan:   Encounter Diagnoses  Name Primary?  . Menometrorrhagia Yes  . Fibroids    - Menometrorrhagia now resolved. - Fibroids stable. - Patient to fu if irregular cycles return.

## 2020-01-31 NOTE — Progress Notes (Signed)
Pt had u/s on 12/2. Pt states she is doing well, no new issues with bleeding. Pt states she had a normal cycle this month but cannot remember dates.

## 2020-02-18 NOTE — L&D Delivery Note (Signed)
Obstetrical Delivery Note   Date of Delivery:   07/21/2020 Primary OB:   [facility] Gestational Age/EDD: 102w6d Reason for Admission: Preterm labor and preterm delivery at home Antepartum complications: PPROM at 80EMV. Difficult social situation  Delivered By:   Patient  Delivery Type:   spontaneous vaginal delivery  Delivery Details:   Patient states she started having labor s/s and subsequently delivered in the shower at home. Patient is unsure of delivery time but states it took about 10 minutes to get to the hospital. Patient arrived via private vehicle and baby was immediately taken by pediatrics and NICU team for evaluation; baby was not alive when initially evaluated by the ED staff Anesthesia:    none Intrapartum complications: Preterm Labor GBS:    unknown Laceration:    none Episiotomy:    none Rectal exam:   deferreed Placenta:    Delivered and expressed via active management. Intact: yes. To pathology: yes.  Estimated Blood Loss:  Total EBL (what I saw on the chux pad when I arrived and during delivery of placenta): 464mL  Baby:    Stillborn female, APGARs 0/0, weight per NICU note  Durene Romans MD Attending Center for [facility])

## 2020-03-02 ENCOUNTER — Encounter (HOSPITAL_COMMUNITY): Payer: Self-pay

## 2020-03-02 ENCOUNTER — Emergency Department (HOSPITAL_COMMUNITY)
Admission: EM | Admit: 2020-03-02 | Discharge: 2020-03-02 | Disposition: A | Discharge status: Home / Self Care | Payer: Medicaid Other | Attending: Emergency Medicine | Admitting: Emergency Medicine

## 2020-03-02 ENCOUNTER — Other Ambulatory Visit: Payer: Self-pay

## 2020-03-02 DIAGNOSIS — R058 Other specified cough: Secondary | ICD-10-CM

## 2020-03-02 DIAGNOSIS — R059 Cough, unspecified: Secondary | ICD-10-CM | POA: Diagnosis present

## 2020-03-02 DIAGNOSIS — F17210 Nicotine dependence, cigarettes, uncomplicated: Secondary | ICD-10-CM | POA: Diagnosis not present

## 2020-03-02 DIAGNOSIS — U071 COVID-19: Secondary | ICD-10-CM | POA: Insufficient documentation

## 2020-03-02 LAB — URINALYSIS, ROUTINE W REFLEX MICROSCOPIC
Bilirubin Urine: NEGATIVE
Glucose, UA: NEGATIVE mg/dL
Hgb urine dipstick: NEGATIVE
Ketones, ur: NEGATIVE mg/dL
Leukocytes,Ua: NEGATIVE
Nitrite: NEGATIVE
Protein, ur: NEGATIVE mg/dL
Specific Gravity, Urine: 1.027 (ref 1.005–1.030)
pH: 5 (ref 5.0–8.0)

## 2020-03-02 LAB — SARS CORONAVIRUS 2 (TAT 6-24 HRS): SARS Coronavirus 2: POSITIVE — AB
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
## 2020-03-02 NOTE — ED Triage Notes (Signed)
Pt states she was exposed to a covid positive person this week, reports generalized body aches and cough. Has no covid vaccines. Pt also reports during triage she thinks she has a UTI and a hernia, had 1 episode of umbilical pain yesterday

## 2020-03-02 NOTE — Discharge Instructions (Signed)
Covid tests results in about 24 hours, review results online or call. Follow-up with PCP, quarantine for 5 days and symptom free if positive. Tylenol/ibuprofen for fever/chills, body aches

## 2020-03-03 ENCOUNTER — Telehealth: Payer: Self-pay | Admitting: Family

## 2020-03-03 NOTE — Telephone Encounter (Signed)
Called to discuss with patient about COVID-19 symptoms and the use of one of the available treatments for those with mild to moderate Covid symptoms and at a high risk of hospitalization.  Pt appears to qualify for outpatient treatment due to co-morbid conditions and/or a member of an at-risk group in accordance with the FDA Emergency Use Authorization.    Symptom onset: 02/28/19 Vaccinated: No  Booster? No  Immunocompromised? No Qualifiers: High SVI risk score, BMI 30  I spoke with Frances Pearson who continues to feel better each day and currently declines treatment. Call back number provided should she change her mind.   Frances Piedra, NP 03/03/2020 10:15 AM

## 2020-03-30 ENCOUNTER — Other Ambulatory Visit (HOSPITAL_COMMUNITY)
Admission: RE | Admit: 2020-03-30 | Discharge: 2020-03-30 | Disposition: A | Discharge status: Home / Self Care | Payer: Medicaid Other | Source: Ambulatory Visit | Attending: Obstetrics and Gynecology | Admitting: Obstetrics and Gynecology

## 2020-03-30 ENCOUNTER — Ambulatory Visit (INDEPENDENT_AMBULATORY_CARE_PROVIDER_SITE_OTHER): Payer: Medicaid Other | Admitting: Obstetrics and Gynecology

## 2020-03-30 ENCOUNTER — Other Ambulatory Visit: Payer: Self-pay

## 2020-03-30 ENCOUNTER — Encounter: Payer: Self-pay | Admitting: Obstetrics and Gynecology

## 2020-03-30 VITALS — BP 124/71 | HR 85 | Ht 65.0 in | Wt 187.0 lb

## 2020-03-30 DIAGNOSIS — Z3201 Encounter for pregnancy test, result positive: Secondary | ICD-10-CM

## 2020-03-30 DIAGNOSIS — R300 Dysuria: Secondary | ICD-10-CM | POA: Insufficient documentation

## 2020-03-30 LAB — POCT URINALYSIS DIPSTICK
Appearance: NORMAL
Bilirubin, UA: NEGATIVE
Blood, UA: NEGATIVE
Glucose, UA: NEGATIVE
Ketones, UA: NEGATIVE
Leukocytes, UA: NEGATIVE
Nitrite, UA: NEGATIVE
Protein, UA: NEGATIVE
Spec Grav, UA: 1.01 (ref 1.010–1.025)
Urobilinogen, UA: 0.2 E.U./dL
pH, UA: 7.5 (ref 5.0–8.0)

## 2020-03-30 MED ORDER — FOSFOMYCIN TROMETHAMINE 3 G PO PACK: 3.0000 g | PACK | Freq: Once | ORAL | 0 refills | Status: AC

## 2020-03-30 MED ORDER — PHENAZOPYRIDINE HCL 200 MG PO TABS: 200.0000 mg | ORAL_TABLET | Freq: Three times a day (TID) | ORAL | 0 refills | Status: DC | PRN
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
## 2020-03-30 NOTE — Progress Notes (Signed)
Pt presents today for UTI. Pt is currently pregnant. States she took at home pregnancy test last weekend that was positive. LMP: 03/07/2020. No BC. She has upcoming appointment for intake on 04/12/20. Pt c/o pain, pressure, burning, and odor with urination for approx. 2-3 weeks. Pt has questions about this pregnancy.

## 2020-04-01 LAB — URINE CULTURE

## 2020-04-02 LAB — CERVICOVAGINAL ANCILLARY ONLY
Chlamydia: NEGATIVE
Comment: NEGATIVE
Comment: NEGATIVE
Comment: NORMAL
Neisseria Gonorrhea: NEGATIVE
Trichomonas: NEGATIVE

## 2020-04-12 ENCOUNTER — Other Ambulatory Visit: Payer: Self-pay

## 2020-04-12 ENCOUNTER — Ambulatory Visit (INDEPENDENT_AMBULATORY_CARE_PROVIDER_SITE_OTHER): Payer: Medicaid Other

## 2020-04-12 VITALS — BP 142/73 | HR 101 | Ht 65.0 in | Wt 194.1 lb

## 2020-04-12 DIAGNOSIS — Z3401 Encounter for supervision of normal first pregnancy, first trimester: Secondary | ICD-10-CM | POA: Diagnosis not present

## 2020-04-12 DIAGNOSIS — O3680X0 Pregnancy with inconclusive fetal viability, not applicable or unspecified: Secondary | ICD-10-CM

## 2020-04-12 DIAGNOSIS — Z3491 Encounter for supervision of normal pregnancy, unspecified, first trimester: Secondary | ICD-10-CM | POA: Diagnosis not present

## 2020-04-12 DIAGNOSIS — Z3687 Encounter for antenatal screening for uncertain dates: Secondary | ICD-10-CM

## 2020-04-12 MED ORDER — BLOOD PRESSURE KIT DEVI: 1.0000 | 0 refills | Status: AC

## 2020-04-12 NOTE — Progress Notes (Signed)
PRENATAL INTAKE SUMMARY  Ms. Maka presents today New OB Nurse Interview.  OB History    Gravida  2   Para  1   Term  1   Preterm      AB      Living  1     SAB      IAB      Ectopic      Multiple      Live Births  1          I have reviewed the patient's medical, obstetrical, social, and family histories, medications, and available lab results.  SUBJECTIVE She has no unusual complaints  OBJECTIVE Initial Physical Exam (New OB)  GENERAL APPEARANCE: alert, well appearing   ASSESSMENT Normal pregnancy  PLAN Prenatal care to be completed at [facility] labs to be completed at [facility] Provider visit Baby Scripts Ordered Blood pressure kit U/S performed today reveals single live IUP at 45w4dby [place] FHR 169 PHQ 2 score: 0 GAD 7 score: 0

## 2020-04-12 NOTE — Progress Notes (Signed)
Patient was assessed and managed by nursing staff during this encounter. I have reviewed the chart and agree with the documentation and plan. I have also made any necessary editorial changes.  Mora Bellman, MD 04/12/2020 2:37 PM

## 2020-04-17 ENCOUNTER — Other Ambulatory Visit: Payer: Self-pay

## 2020-04-17 ENCOUNTER — Telehealth: Payer: Self-pay

## 2020-04-17 DIAGNOSIS — K5900 Constipation, unspecified: Secondary | ICD-10-CM

## 2020-04-17 DIAGNOSIS — O219 Vomiting of pregnancy, unspecified: Secondary | ICD-10-CM

## 2020-04-17 DIAGNOSIS — B379 Candidiasis, unspecified: Secondary | ICD-10-CM

## 2020-04-17 MED ORDER — PROMETHAZINE HCL 25 MG PO TABS: 25.0000 mg | ORAL_TABLET | Freq: Four times a day (QID) | ORAL | 2 refills | Status: DC | PRN

## 2020-04-17 MED ORDER — TERCONAZOLE 0.4 % VA CREA: 1.0000 | TOPICAL_CREAM | Freq: Every day | VAGINAL | 0 refills | Status: DC

## 2020-04-17 MED ORDER — DOCUSATE SODIUM 100 MG PO CAPS: 100.0000 mg | ORAL_CAPSULE | Freq: Two times a day (BID) | ORAL | 2 refills | Status: DC | PRN

## 2020-04-17 NOTE — Telephone Encounter (Signed)
Patient called and left message on triage voicemail requesting rx for nausea, constipation, and yeast infection. Patient states that she just completed an antibiotic for a UTI, and is experiencing yeast sx. She is also taking a prenatal vitamin with iron in which she states is contributing to her constipation. Per Dr. Jodi Mourning okay to send colace. Terconazole and Phenergan sent to the pharmacy per protocol for yeast and nausea.  Patient informed that prescriptions has been sent to the pharmacy.

## 2020-05-03 ENCOUNTER — Ambulatory Visit (INDEPENDENT_AMBULATORY_CARE_PROVIDER_SITE_OTHER): Payer: Medicaid Other

## 2020-05-03 ENCOUNTER — Other Ambulatory Visit (HOSPITAL_COMMUNITY)
Admission: RE | Admit: 2020-05-03 | Discharge: 2020-05-03 | Disposition: A | Discharge status: Home / Self Care | Payer: Medicaid Other | Source: Ambulatory Visit

## 2020-05-03 ENCOUNTER — Other Ambulatory Visit: Payer: Self-pay

## 2020-05-03 VITALS — BP 139/83 | HR 98 | Wt 201.0 lb

## 2020-05-03 DIAGNOSIS — Z3491 Encounter for supervision of normal pregnancy, unspecified, first trimester: Secondary | ICD-10-CM

## 2020-05-03 DIAGNOSIS — Z124 Encounter for screening for malignant neoplasm of cervix: Secondary | ICD-10-CM | POA: Diagnosis not present

## 2020-05-03 DIAGNOSIS — Z3481 Encounter for supervision of other normal pregnancy, first trimester: Secondary | ICD-10-CM

## 2020-05-03 DIAGNOSIS — O99210 Obesity complicating pregnancy, unspecified trimester: Secondary | ICD-10-CM

## 2020-05-03 DIAGNOSIS — Z3A12 12 weeks gestation of pregnancy: Secondary | ICD-10-CM | POA: Diagnosis not present

## 2020-05-03 DIAGNOSIS — O99891 Other specified diseases and conditions complicating pregnancy: Secondary | ICD-10-CM | POA: Insufficient documentation

## 2020-05-03 DIAGNOSIS — Z8659 Personal history of other mental and behavioral disorders: Secondary | ICD-10-CM

## 2020-05-03 DIAGNOSIS — I10 Essential (primary) hypertension: Secondary | ICD-10-CM

## 2020-05-03 MED ORDER — DOXYLAMINE-PYRIDOXINE 10-10 MG PO TBEC: 2.0000 | DELAYED_RELEASE_TABLET | Freq: Every day | ORAL | 1 refills | Status: DC

## 2020-05-03 NOTE — Progress Notes (Signed)
NOB  [redacted]w[redacted]d EDD 11/11/20.  NOB Intake done 04/12/20  Last pap:10/14/2017 WNL   Genetic Testing: Wants Gender.   CC: Patient notes visual changes last happen x 10 days ago.  Pt also notes SOB with weight gain.  Current using yeast treatment Rx cream.
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
## 2020-05-04 DIAGNOSIS — O99210 Obesity complicating pregnancy, unspecified trimester: Secondary | ICD-10-CM | POA: Insufficient documentation

## 2020-05-04 LAB — CBC/D/PLT+RPR+RH+ABO+RUB AB...
Antibody Screen: NEGATIVE
Basophils Absolute: 0 10*3/uL (ref 0.0–0.2)
Basos: 0 %
EOS (ABSOLUTE): 0.1 10*3/uL (ref 0.0–0.4)
Eos: 1 %
HCV Ab: <0.1 s/co ratio (ref 0.0–0.9)
HIV Screen 4th Generation wRfx: NONREACTIVE
Hematocrit: 39.4 % (ref 34.0–46.6)
Hemoglobin: 13.8 g/dL (ref 11.1–15.9)
Hepatitis B Surface Ag: NEGATIVE
Immature Grans (Abs): 0.1 10*3/uL (ref 0.0–0.1)
Immature Granulocytes: 1 %
Lymphocytes Absolute: 1.6 10*3/uL (ref 0.7–3.1)
Lymphs: 17 %
MCH: 29.9 pg (ref 26.6–33.0)
MCHC: 35 g/dL (ref 31.5–35.7)
MCV: 86 fL (ref 79–97)
Monocytes Absolute: 1.3 10*3/uL — ABNORMAL HIGH (ref 0.1–0.9)
Monocytes: 14 %
Neutrophils Absolute: 6.3 10*3/uL (ref 1.4–7.0)
Neutrophils: 67 %
Platelets: 233 10*3/uL (ref 150–450)
RBC: 4.61 x10E6/uL (ref 3.77–5.28)
RDW: 14.6 % (ref 11.7–15.4)
RPR Ser Ql: NONREACTIVE
Rh Factor: POSITIVE
Rubella Antibodies, IGG: 4.87 index (ref >0.99)
WBC: 9.3 10*3/uL (ref 3.4–10.8)

## 2020-05-04 LAB — HCV INTERPRETATION

## 2020-05-04 LAB — PROTEIN / CREATININE RATIO, URINE
Creatinine, Urine: 252 mg/dL
Protein, Ur: 17.3 mg/dL
Protein/Creat Ratio: 69 mg/g creat (ref 0–200)

## 2020-05-04 LAB — HEMOGLOBIN A1C
Est. average glucose Bld gHb Est-mCnc: 97 mg/dL
Hgb A1c MFr Bld: 5 % (ref 4.8–5.6)

## 2020-05-04 LAB — COMPREHENSIVE METABOLIC PANEL
ALT: 16 IU/L (ref 0–32)
AST: 27 IU/L (ref 0–40)
Albumin/Globulin Ratio: 1.8 (ref 1.2–2.2)
Albumin: 4.4 g/dL (ref 3.8–4.8)
Alkaline Phosphatase: 39 IU/L — ABNORMAL LOW (ref 44–121)
BUN/Creatinine Ratio: 9 (ref 9–23)
BUN: 8 mg/dL (ref 6–20)
Bilirubin Total: 0.2 mg/dL (ref 0.0–1.2)
CO2: 19 mmol/L — ABNORMAL LOW (ref 20–29)
Calcium: 9.5 mg/dL (ref 8.7–10.2)
Chloride: 101 mmol/L (ref 96–106)
Creatinine, Ser: 0.86 mg/dL (ref 0.57–1.00)
Globulin, Total: 2.4 g/dL (ref 1.5–4.5)
Glucose: 82 mg/dL (ref 65–99)
Potassium: 3.9 mmol/L (ref 3.5–5.2)
Sodium: 137 mmol/L (ref 134–144)
Total Protein: 6.8 g/dL (ref 6.0–8.5)
eGFR: 91 mL/min/{1.73_m2} (ref >59)

## 2020-05-04 LAB — CERVICOVAGINAL ANCILLARY ONLY
Chlamydia: NEGATIVE
Comment: NEGATIVE
Comment: NORMAL
Neisseria Gonorrhea: NEGATIVE

## 2020-05-04 NOTE — Patient Instructions (Signed)
How to Take Your Blood Pressure Blood pressure is a measurement of how strongly your blood is pressing against the walls of your arteries. Arteries are blood vessels that carry blood from your heart throughout your body. Your health care provider takes your blood pressure at each office visit. You can also take your own blood pressure at home with a blood pressure monitor. You may need to take your own blood pressure to:  Confirm a diagnosis of high blood pressure (hypertension).  Monitor your blood pressure over time.  Make sure your blood pressure medicine is working. Supplies needed:  Blood pressure monitor.  Dining room chair to sit in.  Table or desk.  Small notebook and pencil or pen. How to prepare To get the most accurate reading, avoid the following for 30 minutes before you check your blood pressure:  Drinking caffeine.  Drinking alcohol.  Eating.  Smoking.  Exercising. Five minutes before you check your blood pressure:  Use the bathroom and urinate so that you have an empty bladder.  Sit quietly in a dining room chair. Do not sit in a soft couch or an armchair. Do not talk. How to take your blood pressure To check your blood pressure, follow the instructions in the manual that came with your blood pressure monitor. If you have a digital blood pressure monitor, the instructions may be as follows: 1. Sit up straight in a chair. 2. Place your feet on the floor. Do not cross your ankles or legs. 3. Rest your left arm at the level of your heart on a table or desk or on the arm of a chair. 4. Pull up your shirt sleeve. 5. Wrap the blood pressure cuff around the upper part of your left arm, 1 inch (2.5 cm) above your elbow. It is best to wrap the cuff around bare skin. 6. Fit the cuff snugly around your arm. You should be able to place only one finger between the cuff and your arm. 7. Position the cord so that it rests in the bend of your elbow. 8. Press the power  button. 9. Sit quietly while the cuff inflates and deflates. 10. Read the digital reading on the monitor screen and write the numbers down (record them) in a notebook. 11. Wait 2-3 minutes, then repeat the steps, starting at step 1.   What does my blood pressure reading mean? A blood pressure reading consists of a higher number over a lower number. Ideally, your blood pressure should be below 120/80. The first ("top") number is called the systolic pressure. It is a measure of the pressure in your arteries as your heart beats. The second ("bottom") number is called the diastolic pressure. It is a measure of the pressure in your arteries as the heart relaxes. Blood pressure is classified into five stages. The following are the stages for adults who do not have a short-term serious illness or a chronic condition. Systolic pressure and diastolic pressure are measured in a unit called mm Hg (millimeters of mercury).  Normal  Systolic pressure: below 120.  Diastolic pressure: below 80. Elevated  Systolic pressure: 120-129.  Diastolic pressure: below 80. Hypertension stage 1  Systolic pressure: 130-139.  Diastolic pressure: 80-89. Hypertension stage 2  Systolic pressure: 140 or above.  Diastolic pressure: 90 or above. You can have elevated blood pressure or hypertension even if only the systolic or only the diastolic number in your reading is higher than normal. Follow these instructions at home:  Check your blood   pressure as often as recommended by your health care provider.  Check your blood pressure at the same time every day.  Take your monitor to the next appointment with your health care provider to make sure that: ? You are using it correctly. ? It provides accurate readings.  Be sure you understand what your goal blood pressure numbers are.  Tell your health care provider if you are having any side effects from blood pressure medicine.  Keep all follow-up visits as told by  your health care provider. This is important. General tips  Your health care provider can suggest a reliable monitor that will meet your needs. There are several types of home blood pressure monitors.  Choose a monitor that has an arm cuff. Do not choose a monitor that measures your blood pressure from your wrist or finger.  Choose a cuff that wraps snugly around your upper arm. You should be able to fit only one finger between your arm and the cuff.  You can buy a blood pressure monitor at most drugstores or online. Where to find more information American Heart Association: www.heart.org Contact a health care provider if:  Your blood pressure is consistently high. Get help right away if:  Your systolic blood pressure is higher than 180.  Your diastolic blood pressure is higher than 120. Summary  Blood pressure is a measurement of how strongly your blood is pressing against the walls of your arteries.  A blood pressure reading consists of a higher number over a lower number. Ideally, your blood pressure should be below 120/80.  Check your blood pressure at the same time every day.  Avoid caffeine, alcohol, smoking, and exercise for 30 minutes prior to checking your blood pressure. These agents can affect the accuracy of the blood pressure reading. This information is not intended to replace advice given to you by your health care provider. Make sure you discuss any questions you have with your health care provider. Document Revised: 01/28/2019 Document Reviewed: 01/28/2019 Elsevier Patient Education  2021 Elsevier Inc.  

## 2020-05-05 LAB — CULTURE, OB URINE

## 2020-05-05 LAB — URINE CULTURE, OB REFLEX

## 2020-05-07 ENCOUNTER — Inpatient Hospital Stay (HOSPITAL_COMMUNITY)
Admission: AD | Admit: 2020-05-07 | Discharge: 2020-05-07 | Disposition: A | Discharge status: Home / Self Care | Payer: Medicaid Other | Attending: Family Medicine | Admitting: Family Medicine

## 2020-05-07 ENCOUNTER — Other Ambulatory Visit: Payer: Self-pay

## 2020-05-07 ENCOUNTER — Encounter (HOSPITAL_COMMUNITY): Payer: Self-pay | Admitting: Family Medicine

## 2020-05-07 DIAGNOSIS — Z880 Allergy status to penicillin: Secondary | ICD-10-CM | POA: Insufficient documentation

## 2020-05-07 DIAGNOSIS — O99891 Other specified diseases and conditions complicating pregnancy: Secondary | ICD-10-CM | POA: Insufficient documentation

## 2020-05-07 DIAGNOSIS — Z0371 Encounter for suspected problem with amniotic cavity and membrane ruled out: Secondary | ICD-10-CM | POA: Diagnosis present

## 2020-05-07 DIAGNOSIS — N930 Postcoital and contact bleeding: Secondary | ICD-10-CM | POA: Diagnosis not present

## 2020-05-07 DIAGNOSIS — Z79899 Other long term (current) drug therapy: Secondary | ICD-10-CM | POA: Diagnosis not present

## 2020-05-07 DIAGNOSIS — Z3A13 13 weeks gestation of pregnancy: Secondary | ICD-10-CM | POA: Diagnosis not present

## 2020-05-07 DIAGNOSIS — O209 Hemorrhage in early pregnancy, unspecified: Secondary | ICD-10-CM | POA: Insufficient documentation

## 2020-05-07 DIAGNOSIS — O26891 Other specified pregnancy related conditions, first trimester: Secondary | ICD-10-CM | POA: Diagnosis not present

## 2020-05-07 DIAGNOSIS — Z87891 Personal history of nicotine dependence: Secondary | ICD-10-CM | POA: Insufficient documentation

## 2020-05-07 DIAGNOSIS — N898 Other specified noninflammatory disorders of vagina: Secondary | ICD-10-CM | POA: Diagnosis not present

## 2020-05-07 HISTORY — DX: Unspecified infectious disease: B99.9

## 2020-05-07 HISTORY — DX: Unspecified ovarian cyst, unspecified side: N83.209

## 2020-05-07 HISTORY — DX: Chlamydial infection, unspecified: A74.9

## 2020-05-07 HISTORY — DX: Acne, unspecified: L70.9

## 2020-05-07 HISTORY — DX: Benign neoplasm of connective and other soft tissue, unspecified: D21.9

## 2020-05-07 LAB — URINALYSIS, ROUTINE W REFLEX MICROSCOPIC
Bacteria, UA: NONE SEEN
Bilirubin Urine: NEGATIVE
Glucose, UA: NEGATIVE mg/dL
Ketones, ur: NEGATIVE mg/dL
Leukocytes,Ua: NEGATIVE
Nitrite: NEGATIVE
Protein, ur: NEGATIVE mg/dL
Specific Gravity, Urine: 1.031 — ABNORMAL HIGH (ref 1.005–1.030)
pH: 5 (ref 5.0–8.0)

## 2020-05-07 NOTE — Discharge Instructions (Signed)
Vaginal Bleeding During Pregnancy, First Trimester A small amount of bleeding from the vagina, or spotting, is common during early pregnancy. Some bleeding may be related to the pregnancy, and some may not. In many cases, the bleeding is normal and is not a problem. However, bleeding can also be a sign of something serious. Normal things that may cause bleeding during the first trimester:  Implantation of the fertilized egg in the lining of the uterus.  Rapid changes in blood vessels. This is caused by changes that are happening to the body during pregnancy.  Sex.  Pelvic exams. Abnormal things that may cause bleeding during the first trimester include:  Infection or inflammation of the cervix.  Growths or polyps on the cervix.  Miscarriage or threatened miscarriage.  Pregnancy that is growing outside of the uterus (ectopic pregnancy).  A fertilized egg that becomes a mass of tissue (molar pregnancy). Tell your health care provider right away if there is any bleeding from your vagina. Follow these instructions at home: Monitoring your bleeding Monitor your bleeding.  Pay attention to any changes in your symptoms. Let your health care provider know about any concerns.  Try to understand when the bleeding occurs. Does the bleeding start on its own, or does it start after something is done, such as sex or a pelvic exam?  Use a diary to record the things you see about your bleeding, including: ? The kind of bleeding you are having. Does the bleeding start and stop irregularly, or is it a constant flow? ? The severity of your bleeding. Is the bleeding heavy or light? ? The number of pads you use each day, how often you change them, and how soaked they are.  Tell your health care provider if you pass tissue. He or she may want to see it.   Activity  Follow instructions from your health care provider about limiting your activity. Ask what activities are safe for you.  Do not have  sex until your health care provider says that this is safe.  If needed, make plans for someone to help with your regular activities. General instructions  Take over-the-counter and prescription medicines only as told by your health care provider.  Do not take aspirin because it can cause bleeding.  Do not use tampons or douche.  Keep all follow-up visits. This is important. Contact a health care provider if:  You have vaginal bleeding during any part of your pregnancy.  You have cramps or labor pains.  You have a fever or chills. Get help right away if:  You have severe cramps in your back or abdomen.  You pass large clots or a large amount of tissue from your vagina.  Your bleeding increases.  You feel light-headed or weak, or you faint.  You are leaking fluid or have a gush of fluid from your vagina. Summary  A small amount of bleeding from the vagina is common during early pregnancy.  Be sure to tell your health care provider about any vaginal bleeding right away.  Try to understand when bleeding occurs. Does bleeding occur on its own, or does it occur after something is done, such as sex or pelvic exams?  Keep all follow-up visits. This is important. This information is not intended to replace advice given to you by your health care provider. Make sure you discuss any questions you have with your health care provider. Document Revised: 10/27/2019 Document Reviewed: 10/27/2019 Elsevier Patient Education  2021 Elsevier Inc.  

## 2020-05-07 NOTE — MAU Note (Signed)
Presents with c/o LOF yesterday and continues to leak fluid if presses on abdomen.  Also reports VB, spotting started yesterday.
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
## 2020-05-07 NOTE — MAU Note (Addendum)
Spot of bleeding yesterday(after intercourse), little this morning, none since.  ? Amniotic fluid leaking yesterday and during the night. "pamper" now clean and dry. Denies pain.

## 2020-05-09 DIAGNOSIS — R87811 Vaginal high risk human papillomavirus (HPV) DNA test positive: Secondary | ICD-10-CM | POA: Insufficient documentation

## 2020-05-09 DIAGNOSIS — R87620 Atypical squamous cells of undetermined significance on cytologic smear of vagina (ASC-US): Secondary | ICD-10-CM | POA: Insufficient documentation

## 2020-05-09 LAB — CYTOLOGY - PAP
Comment: NEGATIVE
Comment: NEGATIVE
Diagnosis: UNDETERMINED — AB
HPV 16: NEGATIVE
HPV 18 / 45: NEGATIVE
High risk HPV: POSITIVE — AB

## 2020-05-14 ENCOUNTER — Ambulatory Visit (INDEPENDENT_AMBULATORY_CARE_PROVIDER_SITE_OTHER): Payer: Medicaid Other | Admitting: Licensed Clinical Social Worker

## 2020-05-14 ENCOUNTER — Telehealth: Payer: Self-pay

## 2020-05-14 DIAGNOSIS — Z8659 Personal history of other mental and behavioral disorders: Secondary | ICD-10-CM | POA: Diagnosis not present

## 2020-05-14 NOTE — Telephone Encounter (Addendum)
Pt c/o unable to get Diclegis rx. She was told by the pharmacist to contact her doctor's office.  Conference call with the patient and the pharmacy on the line, Diclegis is out of stock is the issue.  The pharmacy is expecting it to arrive tomorrow.  Pt can check a different pharmacy or take B6 and Unisom together She questioned how much baby ASA should she take. Pt to take 1 tab po 81 mg baby ASA daily.

## 2020-05-14 NOTE — BH Specialist Note (Signed)
Integrated Behavioral Health via Telemedicine Visit  05/14/2020 Colbie Sliker [id number]  Number of [place] visits: 1/6 Session Start time: 9:00am  Session End time: 9:16am Total time: 16 via phone due to patient not ready for video conferencing   Referring Provider: Milinda Cave CNM Patient/Family location: Home  [facility] Provider location: [place]  All persons participating in visit:  Pt Jeanella Cara and LCSWA A. Linton Rump  Types of Service: [place]   I connected with Herschell Dimes and/or [place] via  Telephone or Video Enabled Telemedicine Application  (Video is Caregility application) and verified that I am speaking with the correct person using two identifiers. Discussed confidentiality: yes  I discussed the limitations of telemedicine and the availability of in person appointments.  Discussed there is a possibility of technology failure and discussed alternative modes of communication if that failure occurs.  I discussed that engaging in this telemedicine visit, they consent to the provision of behavioral healthcare and the services will be billed under their insurance.  Patient and/or legal guardian expressed understanding and consented to Telemedicine visit: yes  Presenting Concerns: Patient and/or family reports the following symptoms/concerns: history of depression/anxiety Duration of problem: over one year ; Severity of problem: mild  Patient and/or Family's Strengths/Protective Factors: Secure family and social connection in place   Goals Addressed: Patient will: 1.  Reduce symptoms of: anxiety/ depression   2.  Increase knowledge and/or ability of: coping skills to alleviate symptoms   3.  Demonstrate ability to: self manage symptoms   Progress towards Goals: Ongoing   Interventions: Interventions utilized:  Supportive counseling  Standardized Assessments completed:     Assessment: Patient with history of anxiety and  depression report her symptoms are mild and controlled with copings skills and supportive family. Ms. Weyman reports she was not aware of this appt however she agree to complete visit via phone  Patient may benefit from integrated behavioral health   Plan: 1. Follow up with behavioral health clinician on : as needed  2. Behavioral recommendations: Prioritize rest, communicate needs and continue with mentioned coping skills  3. Referral(s): n/a  I discussed the assessment and treatment plan with the patient and/or parent/guardian. They were provided an opportunity to ask questions and all were answered. They agreed with the plan and demonstrated an understanding of the instructions.   They were advised to call back or seek an in-person evaluation if the symptoms worsen or if the condition fails to improve as anticipated.  Lynnea Ferrier, LCSW

## 2020-05-31 ENCOUNTER — Other Ambulatory Visit: Payer: Self-pay

## 2020-05-31 ENCOUNTER — Ambulatory Visit (INDEPENDENT_AMBULATORY_CARE_PROVIDER_SITE_OTHER): Payer: Medicaid Other | Admitting: Obstetrics and Gynecology

## 2020-05-31 ENCOUNTER — Encounter: Payer: Medicaid Other | Admitting: Advanced Practice Midwife

## 2020-05-31 ENCOUNTER — Encounter: Payer: Self-pay | Admitting: Obstetrics and Gynecology

## 2020-05-31 VITALS — BP 126/75 | HR 96 | Wt 210.0 lb

## 2020-05-31 DIAGNOSIS — Z8659 Personal history of other mental and behavioral disorders: Secondary | ICD-10-CM

## 2020-05-31 DIAGNOSIS — Z148 Genetic carrier of other disease: Secondary | ICD-10-CM

## 2020-05-31 DIAGNOSIS — R87620 Atypical squamous cells of undetermined significance on cytologic smear of vagina (ASC-US): Secondary | ICD-10-CM

## 2020-05-31 DIAGNOSIS — R87811 Vaginal high risk human papillomavirus (HPV) DNA test positive: Secondary | ICD-10-CM | POA: Diagnosis not present

## 2020-05-31 DIAGNOSIS — O99891 Other specified diseases and conditions complicating pregnancy: Secondary | ICD-10-CM

## 2020-05-31 DIAGNOSIS — Z3491 Encounter for supervision of normal pregnancy, unspecified, first trimester: Secondary | ICD-10-CM

## 2020-05-31 DIAGNOSIS — O99212 Obesity complicating pregnancy, second trimester: Secondary | ICD-10-CM

## 2020-05-31 DIAGNOSIS — O99210 Obesity complicating pregnancy, unspecified trimester: Secondary | ICD-10-CM

## 2020-05-31 DIAGNOSIS — Z3A16 16 weeks gestation of pregnancy: Secondary | ICD-10-CM

## 2020-05-31 MED ORDER — ASPIRIN EC 81 MG PO TBEC: 81.0000 mg | DELAYED_RELEASE_TABLET | Freq: Every day | ORAL | 2 refills | Status: DC

## 2020-05-31 NOTE — Progress Notes (Signed)
Colpo today. Pap showed ASCUS HPV+

## 2020-05-31 NOTE — Progress Notes (Signed)
+   Fetal movement. Pt is having some swelling in the feet. Pt states she is not drinking

## 2020-05-31 NOTE — Progress Notes (Signed)
Colposcopy Procedure Note  Frances Pearson is a 35 y.o. G2P1001 here for colposcopy.  Indications:  Pap 04/2020: ASCUS, positive high risk HPV  Procedure Details  LMP n/a; UPT n/a - patient is pregnant.    The risks (including infection, bleeding, pain) and benefits of the procedure were explained to the patient and written informed consent was obtained.  The patient was placed in the dorsal lithotomy position. A Graves was speculum inserted in the vagina, and the cervix was visualized.  The cervix was stained with acetic acid and visualized using the colposcope under magnification as well as with a green filter. Findings as below. All instruments removed. Patient tolerating procedure well.  Findings: small amount of acetowhite at 7 o'clock  Impression: low grade  Adequate: yes  Specimens: none  Condition: Stable  Complications: None  Plan: The patient was advised to call for any fever or for prolonged or severe pain or bleeding. She was advised to use OTC analgesics as needed for mild to moderate pain. She was advised to avoid vaginal intercourse for 48 hours or until the bleeding has completely stopped.  Will base further management on results of biopsy.   Frances Beam, MD, [place] [facility])

## 2020-05-31 NOTE — Progress Notes (Signed)
   PRENATAL VISIT NOTE  Subjective:  Frances Pearson is a 35 y.o. G2P1001 at [redacted]w[redacted]d being seen today for ongoing prenatal care.  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  Patient reports no complaints.  Contractions: Not present. Vag. Bleeding: None.  Movement: Present. Denies leaking of fluid.   The following portions of the patient's history were reviewed and updated as appropriate: allergies, current medications, past family history, past medical history, past social history, past surgical history and problem list.   Objective:   Vitals:   05/31/20 0905  BP: 126/75  Pulse: 96  Weight: 210 lb (95.3 kg)    Fetal Status: Fetal Heart Rate (bpm): 142   Movement: Present     General:  Alert, oriented and cooperative. Patient is in no acute distress.  Skin: Skin is warm and dry. No rash noted.   Cardiovascular: Normal heart rate noted  Respiratory: Normal respiratory effort, no problems with respiration noted  Abdomen: Soft, gravid, appropriate for gestational age.  Pain/Pressure: Absent     Pelvic: Cervical exam deferred        Extremities: Normal range of motion.  Edema: Trace  Mental Status: Normal mood and affect. Normal behavior. Normal judgment and thought content.   Assessment and Plan:  Pregnancy: G2P1001 at [redacted]w[redacted]d 1. Encounter for supervision of normal pregnancy in first trimester, unspecified gravidity - AFP, Serum, Open Spina Bifida  2. Obesity in pregnancy, antepartum  3. ASCUS with positive high risk human papillomavirus of vagina colpo today  4. H/O postpartum depression, currently pregnant  5. [redacted] weeks  gestation of pregnancy  6. 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 Please refer to After Visit Summary for other counseling recommendations.   Return in about 4 weeks (around 06/28/2020) for low OB, in person.  Future Appointments  Date Time Provider [place]  06/18/2020  9:30 AM [facility] NURSE [facility]  06/18/2020  9:45 AM [facility] [place]    Sloan Leiter, MD

## 2020-06-02 LAB — AFP, SERUM, OPEN SPINA BIFIDA
AFP MoM: 1.24
AFP Value: 40.8 ng/mL
Gest. Age on Collection Date: 16.4 weeks
Maternal Age At EDD: 34.5 yr
OSBR Risk 1 IN: 10000
Test Results:: NEGATIVE
Weight: 210 [lb_av]

## 2020-06-18 ENCOUNTER — Encounter: Payer: Self-pay | Admitting: *Deleted

## 2020-06-18 ENCOUNTER — Ambulatory Visit (HOSPITAL_BASED_OUTPATIENT_CLINIC_OR_DEPARTMENT_OTHER): Payer: Medicaid Other | Admitting: Genetic Counselor

## 2020-06-18 ENCOUNTER — Ambulatory Visit: Payer: Medicaid Other

## 2020-06-18 ENCOUNTER — Ambulatory Visit: Payer: Medicaid Other | Admitting: *Deleted

## 2020-06-18 ENCOUNTER — Ambulatory Visit (HOSPITAL_BASED_OUTPATIENT_CLINIC_OR_DEPARTMENT_OTHER): Payer: Medicaid Other | Admitting: Obstetrics and Gynecology

## 2020-06-18 ENCOUNTER — Other Ambulatory Visit: Payer: Self-pay

## 2020-06-18 ENCOUNTER — Other Ambulatory Visit: Payer: Self-pay | Admitting: *Deleted

## 2020-06-18 VITALS — BP 129/57 | HR 91

## 2020-06-18 DIAGNOSIS — D251 Intramural leiomyoma of uterus: Secondary | ICD-10-CM | POA: Diagnosis not present

## 2020-06-18 DIAGNOSIS — O42912 Preterm premature rupture of membranes, unspecified as to length of time between rupture and onset of labor, second trimester: Secondary | ICD-10-CM | POA: Insufficient documentation

## 2020-06-18 DIAGNOSIS — Z3689 Encounter for other specified antenatal screening: Secondary | ICD-10-CM | POA: Diagnosis not present

## 2020-06-18 DIAGNOSIS — O358XX1 Maternal care for other (suspected) fetal abnormality and damage, fetus 1: Secondary | ICD-10-CM | POA: Diagnosis not present

## 2020-06-18 DIAGNOSIS — Z8659 Personal history of other mental and behavioral disorders: Secondary | ICD-10-CM

## 2020-06-18 DIAGNOSIS — O4290 Premature rupture of membranes, unspecified as to length of time between rupture and onset of labor, unspecified weeks of gestation: Secondary | ICD-10-CM

## 2020-06-18 DIAGNOSIS — Z315 Encounter for genetic counseling: Secondary | ICD-10-CM | POA: Insufficient documentation

## 2020-06-18 DIAGNOSIS — I10 Essential (primary) hypertension: Secondary | ICD-10-CM

## 2020-06-18 DIAGNOSIS — O3412 Maternal care for benign tumor of corpus uteri, second trimester: Secondary | ICD-10-CM

## 2020-06-18 DIAGNOSIS — Z148 Genetic carrier of other disease: Secondary | ICD-10-CM | POA: Diagnosis not present

## 2020-06-18 DIAGNOSIS — Z8759 Personal history of other complications of pregnancy, childbirth and the puerperium: Secondary | ICD-10-CM

## 2020-06-18 DIAGNOSIS — Z3A19 19 weeks gestation of pregnancy: Secondary | ICD-10-CM

## 2020-06-18 DIAGNOSIS — O99210 Obesity complicating pregnancy, unspecified trimester: Secondary | ICD-10-CM | POA: Diagnosis not present

## 2020-06-18 DIAGNOSIS — O10912 Unspecified pre-existing hypertension complicating pregnancy, second trimester: Secondary | ICD-10-CM | POA: Insufficient documentation

## 2020-06-18 DIAGNOSIS — O3410 Maternal care for benign tumor of corpus uteri, unspecified trimester: Secondary | ICD-10-CM | POA: Diagnosis not present

## 2020-06-18 DIAGNOSIS — D259 Leiomyoma of uterus, unspecified: Secondary | ICD-10-CM

## 2020-06-18 DIAGNOSIS — O99212 Obesity complicating pregnancy, second trimester: Secondary | ICD-10-CM | POA: Diagnosis not present

## 2020-06-18 DIAGNOSIS — Z3491 Encounter for supervision of normal pregnancy, unspecified, first trimester: Secondary | ICD-10-CM | POA: Diagnosis not present

## 2020-06-18 DIAGNOSIS — D582 Other hemoglobinopathies: Secondary | ICD-10-CM | POA: Diagnosis not present

## 2020-06-18 DIAGNOSIS — O4102X0 Oligohydramnios, second trimester, not applicable or unspecified: Secondary | ICD-10-CM | POA: Insufficient documentation

## 2020-06-18 NOTE — Progress Notes (Signed)
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 Frances Pearson was accompanied to her appointment by her partner, Darci Needle.   Indication for genetic counseling - Hemoglobin C trait  Prenatal history  Frances Pearson is a G2P1001, 35 y.o. female. Her current pregnancy has completed [redacted]w[redacted]d (Estimated Date of Delivery: 11/11/20). Frances Pearson has a 66 year old daughter from a prior relationship. The current pregnancy is the first for this couple.  Frances Pearson denied exposure to environmental toxins or chemical agents. She quit smoking cigarettes, drinking alcohol, and using CBD when she found out she was pregnant around 7 weeks' gestation. She reported taking prenatal vitamins, vitamin C, and aspirin. She denied significant viral illnesses, fevers, and bleeding during the course of her pregnancy. She reported a personal history of anemia for which she has had blood transfusions. She also has uterine fibroids. Her medical and surgical histories were otherwise noncontributory.  Family History  A three generation pedigree was drafted and reviewed. The family history is remarkable for the following:  - Frances Pearson's sister was born with a "hole in her heart" that did not require surgery. The exact etiology of this congenital heart defect (CHD) is unknown. There are multifactorial causes for isolated CHDs, including environmental and genetic factors. As such, the recurrence risk for first degree relatives is elevated over the general population incidence of 1/200 (0.5%). We discussed that if Frances Pearson sister's CHD does not have an identifiable genetic cause, the risk for Frances Pearson's children to have a CHD would be ~1%. However, without details about the etiology of her sister's CHD, precise risk assessment is limited.   - Frances Pearson has a maternal family history of "bone,  blood, and stomach" cancer. We reviewed that although most cancers are thought to be sporadic or due to environmental factors, some families appear to have a strong predisposition to cancers. When considering a family history of cancer, we look for common types of cancer in multiple family members occurring at younger than typical ages. We discussed the option of meeting with a cancer genetic counselor to discuss any possible screening or testing options available. If Frances Pearson is concerned about the family history of cancer and would like to learn more about the family's chance for an inherited cancer syndrome, she or her healthcare provider may refer her to the [facility] [phone]).   The remaining family histories were reviewed and found to be noncontributory for birth defects, intellectual disability, recurrent pregnancy loss, and known genetic conditions. Frances Pearson had limited information about her paternal family history; thus, risk assessment was limited.  The patient's ancestry is [place], [place], Native American ([place]), [place], Hispanic ([place]), and Caucasian ([place], [place]). The father of the pregnancy's ancestry is African [place] Consanguinity was denied. Ms. Eland believed she had Ashkenazi Jewish ancestry but was not fully certain about this. Pedigree will be scanned under Media.  Discussion  Hemoglobin C trait:  Frances Pearson was referred for genetic counseling as she had Horizon-4 carrier screening performed through [place] that identified her as a carrier for hemoglobin C trait.   We discussed that hemoglobin C is a variant form of hemoglobin caused by a specific mutation in the HBB gene. The HBB gene encodes for a protein called beta-globin, which is a subunit of hemoglobin. Hemoglobin within  red blood cells binds to oxygen molecules in the lungs and delivers oxygen to tissues throughout the body. There are many different types  of hemoglobin, with hemoglobin A being the most common form. Mutations in the HBB gene cause variant forms of hemoglobin to be produced, such as hemoglobin C and hemoglobin S. Individuals with hemoglobin C trait are carriers for hemoglobin C disease (HbC disease), whereas individuals with hemoglobin S trait are carriers for sickle cell disease. Since Frances Pearson carries hemoglobin C trait, this indicates that she is a carrier for HbC disease.   Individuals with HbC disease have red blood cells that contain mostly hemoglobin C. Too much hemoglobin C can reduce the number of size of red blood cells in the body, causing mild anemia.  As a result, individuals with HbC disease may experience symptoms such as weakness, fatigue, and splenomegaly. HbC disease is inherited in an autosomal recessive pattern, where both parents must carry hemoglobin C trait to be at risk of having an affected child. If Frances Pearson partner were also a carrier of HbC disease, they would have a 1 in 4 (25%) chance of having a child with HbC disease.    We also reviewed that it is possible that Frances Pearson's partner could carry another variant form of hemoglobin, such as hemoglobin S. If he did, the couple would have a 1 in 4 (25%) chance of having a child with hemoglobin  disease (HbSC disease). Individuals with HbSC disease have red blood cells that contain both hemoglobin S and hemoglobin C. These variant forms of hemoglobin can cause red blood cells to become rigid and sickle, blocking small blood vessels and making it difficult for the red blood cells to deliver oxygen to the body's tissues. This can cause severe pain and organ damage, just as we see in individuals with sickle cell disease. Individuals with HbSC disease are at risk of the same complications as those associated with sickle cell disease, such as pain crises, acute chest syndrome, infections, asplenia, and strokes; however, these complications may occur at a lesser frequency.  We  discussed that Ms. Town's partner may be a carrier for other variants in the HBB gene (such as beta-thalassemia) that could combine with HbC trait to cause other symptoms in an affected child. Partner carrier screening of the HBB gene is recommended to determine which, if any, conditions the current fetus and the couple's future pregnancies could be at increased risk for. Based on his ethnicity, Ms. Bogdan partner has a 1 in 8 chance of being a carrier for an HBB-related hemoglobinopathy. Thus, the chance for the current fetus to be affected with a hemoglobinopathy is currently 1 in 32 (~3%). Ms. Fouche and her partner expressed interest in partner carrier screening. The couple was informed that all babies born in [place] are screened for hemoglobinopathies as a part of [place]'s newborn screening as well.  Ms. Silveri carrier screening was negative for the other 3 conditions screened. Thus, her risk to be a carrier for these additional conditions (listed separately in the laboratory report) has been reduced but not eliminated. This also significantly reduces her risk of having a child affected by one of these conditions.   Aneuploidy screening results:  We also reviewed that Ms. Frutiger had [facility] noninvasive prenatal screening (NIPS) through the laboratory [facility] that was low-risk for fetal aneuploidies. We reviewed that these results showed a less than 1 in 10,000 risk for trisomies 21, 18 and 13, and monosomy X (Turner  syndrome). In addition, the risk for triploidy and sex chromosome trisomies (47,XXX and 47,XXY) was also low. Ms. Makar elected to have cfDNA analysis for 22q11.2 deletion syndrome, which was also low risk (1 in 3100). We reviewed that while this testing identifies 94-99% of pregnancies with trisomy 66, trisomy 48, trisomy 78, and >70% of cases of sex chromosome aneuploidies, and triploidy, it is NOT diagnostic. A positive test result requires confirmation by CVS or amniocentesis, and a  negative test result does not rule out a fetal chromosome abnormality. She also understands that this testing does not identify all genetic conditions.  Diagnostic testing:   Ms. Kalama was also counseled regarding diagnostic testing via amniocentesis. We discussed the technical aspects of the procedure and quoted up to a 1 in 500 (0.2%) risk for spontaneous pregnancy loss or other adverse pregnancy outcomes as a result of amniocentesis. Cultured cells from an amniocentesis sample allow for the visualization of a fetal karyotype, which can detect >99% of large chromosomal aberrations. Chromosomal microarray can also be performed to identify smaller deletions or duplications of fetal chromosomal material. Amniocentesis could also be performed to assess whether the baby is affected by a hemoglobinopathy. After careful consideration, Ms. Cogan declined amniocentesis at this time. She understands that amniocentesis is available at any point after 16 weeks of pregnancy and that she may opt to undergo the procedure at a later date should she change her mind.  Plan:  Ms. Creech and Mr. Shon Baton initially informed me that they would like to pursue partner carrier screening of the HBB gene. Mr. Shon Baton has insurance through his employer but did not have a copy of his card with him today. He attempted to call his insurance company during Ms. Talbot's ultrasound following her genetic counseling consultation to get his insurance information so that I could place the order for his testing. However, after Ms. Nobile abnormal ultrasound findings (see Ultrasound report for further details), the couple declined partner carrier screening at this time. The couple has my contact information and may contact me if they would like to pursue partner carrier screening in the future.  I counseled Ms. Bucks regarding the above risks and available options. The approximate face-to-face time with the genetic counselor was 50 minutes.  In  summary:  Discussed carrier screening results and options for follow-up testing  Carrier of hemoglobin C trait  Initially desired partner carrier screening, but delayed testing following abnormal ultrasound. Patient may contact me if she would like me to resume coordinating partner carrier screening in the future  Reviewed low-risk NIPS result  Reduction in risk for Down syndrome, trisomy 34, trisomy 65, triploidy, sex chromosome aneuploidies, and 22q11.2 deletion syndrome  Offered additional testing and screening  Declined amniocentesis  Reviewed family history concerns   Buelah Manis, MS, Counselling psychologist

## 2020-06-18 NOTE — Progress Notes (Addendum)
Maternal-Fetal Medicine   Name: Frances Pearson DOB: [birth year] MRN: [id number] Referring Provider: Gavin Pound, CNM  I had the pleasure of seeing Frances Pearson today at the [facility] for Maternal Fetal Care. She is a G2 P1001 at 19w gestation and is here for ultrasound. She was accompanied by her partner. The couple met with our genetic counselor (patient has hemoglobin C trait). You will be receiving a separate letter from our genetic counselor.  Patient reports she has been having some leakage of fluid, which she attributed to urinary incontinence. She was evaluated at the [facility] a month ago. She does not have fever or abdominal pain or vaginal bleeding.  Her prenatal course has, otherwise, been uneventful. On cell-free fetal DNA screening, the risks of fetal aneuploidies are not increased.  Obstetric history is significant for a term vaginal delivery in 2008 of a female infant weighing 8-6 at birth. Her daughter is in good health.  Her current pregnancy is from a different partner. Patient has a diagnosis of chronic hypertension (not on antihypertensives) She does not have diabetes or any other chronic medical conditions.  Social: Denies tobacco or drug or alcohol use. Medications: Prenatal vitamins.  On ultrasound, anhydramnios is seen with no measurable pocket of amniotic fluid. Fetal biometry is consistent with her previously established dates. Fetal anatomical survey is very limited because of anhydramnios. Both kidneys are seen and appear normal. Placenta appears normal with no retroplacental hemorrhage.An echogenic intracardiac focus is seen (no increased risk for Down syndrome on cell-free fetal DNA screening).  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  Our concerns include: PPROM before viability -About 50% deliver between 1 to 2 weeks after PPROM and about 85% deliver within a month.   -Lung development takes place between 40- and 24-weeks' gestation.  Pulmonary hypoplasia can occur if PPROM occurs early in gestation. Pulmonary hypoplasia cannot be predicted on ultrasound. If the newborn has severe pulmonary hypoplasia, there is increased postnatal mortality. One would expect a 20% chance of having pulmonary hypoplasia after ROM at 20 weeks.   -Other complications include maternal/fetal infections, placental abruption, cord prolapse, fetal demise and limb contractures (reversible).   -Expectant management as an inpatient from 23 weeks. Discussed the benefit of antenatal corticosteroids and prophylactic antibiotics (from 23 to 24 weeks).   -Outpatient management before viability is recommended. I advised the patient to abstain from sexual intercourse, vaginal examination and insertion of tampons.   -It is unlikely antibiotics will help at this gestational age and no clear recommendation exists.   -If expectant management is successful, we recommend delivery at 34 weeks as risk from intraamniotic infection outweighs the neonatal benefits at that gestational age.   -Neonatal outcomes are gestational-age dependent. If delivery occurs before 28 weeks' gestation, prematurity complications are higher. Poor neurodevelopmental outcomes including cerebral palsy are higher.   -Delivery would be recommended regardless of gestational age if signs of maternal/fetal infections are present.   -Termination of pregnancy is an option if mother perceives increased maternal or fetal complications. Patient is very firm in her decision to continue her pregnancy. -Amnioinfusion has been performed in some centers but has not shown to improve neonatal outcomes.  PPROM is the most-likely cause of anhydramnios (history). Patient is aware of spontaneous onset of labor and fetal demise in some cases.  Recommendations -Follow-up limited ultrasound evaluation in 2 weeks. -Admit at 23 weeks' gestation and continue inpatient management till delivery. -Antenatal  corticosteroids at 23 weeks. -Neonatal consultation after admission.  Thank you for consultation.  If you have any questions, please contact me at the Center for maternal-fetal care.  Consultation including face-to-face counseling 30 minutes.

## 2020-06-19 ENCOUNTER — Ambulatory Visit: Payer: Medicaid Other

## 2020-06-22 ENCOUNTER — Ambulatory Visit: Payer: Self-pay

## 2020-06-27 ENCOUNTER — Ambulatory Visit (INDEPENDENT_AMBULATORY_CARE_PROVIDER_SITE_OTHER): Payer: Medicaid Other | Admitting: Obstetrics

## 2020-06-27 ENCOUNTER — Other Ambulatory Visit: Payer: Self-pay

## 2020-06-27 ENCOUNTER — Encounter: Payer: Self-pay | Admitting: Obstetrics

## 2020-06-27 VITALS — BP 145/85 | HR 91 | Wt 214.0 lb

## 2020-06-27 DIAGNOSIS — O0990 Supervision of high risk pregnancy, unspecified, unspecified trimester: Secondary | ICD-10-CM

## 2020-06-27 DIAGNOSIS — O4102X0 Oligohydramnios, second trimester, not applicable or unspecified: Secondary | ICD-10-CM

## 2020-06-27 NOTE — Progress Notes (Signed)
Subjective:  Frances Pearson is a 35 y.o. G2P1001 at [redacted]w[redacted]d being seen today for ongoing prenatal care.  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  Patient reports leaking amniotic.fluid  Contractions: Not present. Vag. Bleeding: None.  Movement: Present.   The following portions of the patient's history were reviewed and updated as appropriate: allergies, current medications, past family history, past medical history, past social history, past surgical history and problem list. Problem list updated.  Objective:   Vitals:   06/27/20 1612 06/27/20 1615  BP: (!) 159/81 (!) 145/85  Pulse:  91  Weight: 214 lb (97.1 kg) 214 lb (97.1 kg)    Fetal Status:     Movement: Present     General:  Alert, oriented and cooperative. Patient is in no acute distress.  Skin: Skin is warm and dry. No rash noted.   Cardiovascular: Normal heart rate noted  Respiratory: Normal respiratory effort, no problems with respiration noted  Abdomen: Soft, gravid, appropriate for gestational age. Pain/Pressure: Present     Pelvic:  Cervical exam deferred        Extremities: Normal range of motion.     Mental Status: Normal mood and affect. Normal behavior. Normal judgment and thought content.   Urinalysis:      Assessment and Plan:  Pregnancy: G2P1001 at [redacted]w[redacted]d  1. Supervision of high risk pregnancy, antepartum  2. Anhydramnios in second trimester, single or unspecified fetus - modified bedrest with pelvic rest - admit at 23 weeks for PPROM  Preterm labor symptoms and general obstetric precautions including but not  limited to vaginal bleeding, contractions, leaking of fluid and fetal movement were reviewed in detail with the patient. Please refer to After Visit Summary for other counseling recommendations.   Return in about 1 week (around 07/04/2020).   Shelly Bombard, MD  06/27/20

## 2020-06-28 ENCOUNTER — Encounter: Payer: Medicaid Other | Admitting: Obstetrics and Gynecology

## 2020-07-04 ENCOUNTER — Ambulatory Visit: Payer: Medicaid Other | Attending: Obstetrics and Gynecology

## 2020-07-04 ENCOUNTER — Encounter: Payer: Self-pay | Admitting: *Deleted

## 2020-07-04 ENCOUNTER — Other Ambulatory Visit: Payer: Self-pay

## 2020-07-04 ENCOUNTER — Ambulatory Visit: Payer: Medicaid Other | Admitting: *Deleted

## 2020-07-04 VITALS — BP 138/83 | HR 96

## 2020-07-04 DIAGNOSIS — Z362 Encounter for other antenatal screening follow-up: Secondary | ICD-10-CM

## 2020-07-04 DIAGNOSIS — O358XX0 Maternal care for other (suspected) fetal abnormality and damage, not applicable or unspecified: Secondary | ICD-10-CM | POA: Diagnosis not present

## 2020-07-04 DIAGNOSIS — Z148 Genetic carrier of other disease: Secondary | ICD-10-CM

## 2020-07-04 DIAGNOSIS — O4102X0 Oligohydramnios, second trimester, not applicable or unspecified: Secondary | ICD-10-CM

## 2020-07-04 DIAGNOSIS — D259 Leiomyoma of uterus, unspecified: Secondary | ICD-10-CM

## 2020-07-04 DIAGNOSIS — O3412 Maternal care for benign tumor of corpus uteri, second trimester: Secondary | ICD-10-CM

## 2020-07-04 DIAGNOSIS — O42912 Preterm premature rupture of membranes, unspecified as to length of time between rupture and onset of labor, second trimester: Secondary | ICD-10-CM | POA: Diagnosis not present

## 2020-07-04 DIAGNOSIS — O42112 Preterm premature rupture of membranes, onset of labor more than 24 hours following rupture, second trimester: Secondary | ICD-10-CM

## 2020-07-04 DIAGNOSIS — O99332 Smoking (tobacco) complicating pregnancy, second trimester: Secondary | ICD-10-CM

## 2020-07-04 DIAGNOSIS — Z3A21 21 weeks gestation of pregnancy: Secondary | ICD-10-CM

## 2020-07-04 DIAGNOSIS — O10012 Pre-existing essential hypertension complicating pregnancy, second trimester: Secondary | ICD-10-CM

## 2020-07-04 DIAGNOSIS — Z8759 Personal history of other complications of pregnancy, childbirth and the puerperium: Secondary | ICD-10-CM | POA: Diagnosis not present

## 2020-07-11 ENCOUNTER — Encounter: Payer: Self-pay | Admitting: Obstetrics and Gynecology

## 2020-07-11 ENCOUNTER — Other Ambulatory Visit: Payer: Self-pay

## 2020-07-11 ENCOUNTER — Ambulatory Visit (INDEPENDENT_AMBULATORY_CARE_PROVIDER_SITE_OTHER): Payer: Medicaid Other | Admitting: Obstetrics and Gynecology

## 2020-07-11 DIAGNOSIS — Z3A22 22 weeks gestation of pregnancy: Secondary | ICD-10-CM

## 2020-07-11 DIAGNOSIS — Z3491 Encounter for supervision of normal pregnancy, unspecified, first trimester: Secondary | ICD-10-CM

## 2020-07-11 DIAGNOSIS — O4102X1 Oligohydramnios, second trimester, fetus 1: Secondary | ICD-10-CM | POA: Diagnosis not present

## 2020-07-11 DIAGNOSIS — O4102X0 Oligohydramnios, second trimester, not applicable or unspecified: Secondary | ICD-10-CM

## 2020-07-11 NOTE — Progress Notes (Signed)
Subjective:  Frances Pearson is a 35 y.o. G2P1001 at [redacted]w[redacted]d being seen today for ongoing prenatal care.  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  Patient reports no fever, vaginal bleeding or cramps/contractions.  Contractions: Irritability. Vag. Bleeding: None.  Movement: Present. Denies leaking of fluid.   The following portions of the patient's history were reviewed and updated as appropriate: allergies, current medications, past family history, past medical history, past social history, past surgical history and problem list. Problem list updated.  Objective:   Vitals:   07/11/20 0850  BP: (!) 158/75  Pulse: (!) 111  Weight: 217 lb 6.4 oz (98.6 kg)    Fetal Status: Fetal Heart Rate (bpm): 157   Movement: Present     General:  Alert, oriented and cooperative. Patient is in no acute distress.  Skin: Skin is warm and dry. No rash noted.   Cardiovascular: Normal heart rate noted  Respiratory: Normal respiratory effort, no problems with respiration noted  Abdomen: Soft, gravid, appropriate for gestational age. Pain/Pressure: Absent     Pelvic:  Cervical exam deferred        Extremities: Normal range of motion.  Edema: None  Mental Status: Normal mood and affect. Normal behavior. Normal judgment and thought content.   Urinalysis:      Assessment and Plan:  Pregnancy: G2P1001 at [redacted]w[redacted]d  1. Anhydramnios in second trimester, single or unspecified fetus Reviewed with pt. Hospitalization at 23 weeks has been recommended and reviewed with pt. Pt is unable to be hospitalized due to family matters and child care. Will  have pt return next week x 2 for BMZ x 2. Note sent to MFM for recommendations of timing for f/u U/S.   2. Encounter for supervision of normal pregnancy in first trimester, unspecified gravidity Stable See above  Preterm labor symptoms and general obstetric precautions including but not limited to vaginal bleeding, contractions, leaking of fluid and fetal movement were reviewed in detail with the patient. Please refer to After Visit Summary for other counseling recommendations.  Return in about 2 weeks (around 07/25/2020) for OB visit, face to face, MD only.   Chancy Milroy, MD

## 2020-07-11 NOTE — Progress Notes (Signed)
Reports N/V/D 2 nights ago. Improved now. Has not taken ASA for a couple days. Switched to fish oil per boyfriend's suggestion.

## 2020-07-11 NOTE — Patient Instructions (Signed)
Preterm Labor Pregnancy normally lasts 39-41 weeks. Preterm labor is when labor starts before you have been pregnant for 37 weeks. Babies who are born too early may have problems with blood sugar, body temperature, heart, and breathing. These problems may be very serious in babies who are born before 54 weeks of pregnancy. What are the causes? The cause of this condition is not known. What increases the risk? You are more likely to have preterm labor if:  You have medical problems, now or in the past.  You have problems now or in your past pregnancies.  You have lifestyle problems. Medical history  You have problems of the womb (uterus).  You have an infection, including infections you get from sex.  You have problems that do not go away, such as: ? Blood clots. ? High blood pressure. ? High blood sugar.  You have low body weight or too much body weight. Present and past pregnancies  You have had preterm labor before.  You are pregnant with two babies or more.  You have a condition in which the placenta covers your cervix.  You waited less than 6 months between giving birth and becoming pregnant again.  Your unborn baby has some problems.  You have bleeding from your vagina.  You became pregnant by a method called IVF. Lifestyle  You smoke.  You drink alcohol.  You use drugs.  You have stress.  You have abuse in your home.  You come in contact with chemicals that harm the body (pollutants). Other factors  You are younger than 17 years or older than 35 years. What are the signs or symptoms? Symptoms of this condition include:  Cramps. The cramps may feel like cramps from a period.  You may have watery poop (diarrhea).  Pain in the belly (abdomen).  Pain in the lower back.  Regular contractions. It may feel like your belly is getting tighter.  Pressure in the lower belly.  More fluid leaking from the vagina. The fluid may be watery or  bloody.  Water breaking. How is this treated? Treatment for this condition depends on your health, the health of your baby, and how old your pregnancy is. It may include:  Taking medicines, such as: ? Hormone medicines. ? Medicines to stop contractions. ? Medicines to help mature the baby's lungs. ? Medicines to prevent your baby from getting cerebral palsy.  Bed rest. If the labor happens before 34 weeks of pregnancy, you may need to stay in the hospital.  Delivering the baby. Follow these instructions at home:  Do not use any products that contain nicotine or tobacco, such as cigarettes, e-cigarettes, and chewing tobacco. If you need help quitting, ask your doctor.  Do not drink alcohol.  Take over-the-counter and prescription medicines only as told by your doctor.  Rest as told by your doctor.  Return to your activities as told by your doctor. Ask your doctor what activities are safe for you.  Keep all follow-up visits as told by your doctor. This is important.   How is this prevented? To have a healthy pregnancy:  Do not use street drugs.  Do not use any medicines unless you ask your doctor if they are safe for you.  Talk with your doctor before taking any herbal supplements.  Make sure you gain enough weight.  Watch for infection. If you think you might have an infection, get it checked right away. Symptoms of infection may include: ? Fever. ? Vaginal discharge. ?  Pain or burning when you pee. ? Needing to pee urgently. ? Needing to pee often. ? Peeing small amounts often. ? Blood in your pee. ? Pee that smells bad or unusual.  Tell your doctor if you have gone into preterm labor before. Contact a doctor if:  You think you are going into preterm labor.  You have symptoms of preterm labor.  You have symptoms of infection. Get help right away if:  You are having painful contractions every 5 minutes or less.  Your water breaks. Summary  Preterm labor  is labor that starts before you reach 37 weeks of pregnancy.  Your baby may have problems if delivered early.  The cause of preterm labor is not known. Having problems of the womb (uterus), an infection, or bleeding during pregnancy increases the risk.  Contact a doctor if you have signs or symptoms of preterm labor. This information is not intended to replace advice given to you by your health care provider. Make sure you discuss any questions you have with your health care provider. Document Revised: 03/08/2019 Document Reviewed: 03/08/2019 Elsevier Patient Education  2021 [employer].

## 2020-07-12 ENCOUNTER — Other Ambulatory Visit: Payer: Self-pay | Admitting: Obstetrics and Gynecology

## 2020-07-12 DIAGNOSIS — O4102X0 Oligohydramnios, second trimester, not applicable or unspecified: Secondary | ICD-10-CM

## 2020-07-12 NOTE — Progress Notes (Signed)
U/S order placed

## 2020-07-13 ENCOUNTER — Other Ambulatory Visit: Payer: Self-pay | Admitting: Obstetrics and Gynecology

## 2020-07-13 DIAGNOSIS — O4102X0 Oligohydramnios, second trimester, not applicable or unspecified: Secondary | ICD-10-CM

## 2020-07-13 NOTE — Progress Notes (Unsigned)
OB U/S ordered

## 2020-07-17 ENCOUNTER — Other Ambulatory Visit: Payer: Self-pay

## 2020-07-17 ENCOUNTER — Ambulatory Visit (INDEPENDENT_AMBULATORY_CARE_PROVIDER_SITE_OTHER): Payer: Medicaid Other

## 2020-07-17 DIAGNOSIS — O4102X0 Oligohydramnios, second trimester, not applicable or unspecified: Secondary | ICD-10-CM

## 2020-07-17 DIAGNOSIS — Z3A23 23 weeks gestation of pregnancy: Secondary | ICD-10-CM

## 2020-07-17 MED ORDER — BETAMETHASONE SOD PHOS & ACET 6 (3-3) MG/ML IJ SUSP
12.0000 mg | Freq: Once | INTRAMUSCULAR | Status: AC
  Administered 2020-07-17: 12 mg via INTRAMUSCULAR

## 2020-07-17 NOTE — Progress Notes (Addendum)
Subjective:  Frances Pearson is a 35 y.o. female here for 1st. Betamethasone Injection.   Objective:  BP 125/77   Pulse (!) 105   Wt 213 lb (96.6 kg)   LMP 03/07/2020   BMI 35.45 kg/m   Appearance alert, well appearing, and in no distress. BP noted to be well controlled today in office.   Plan:  2 ml Betamethasone given today.  Return to Office in 24 hours for 2nd. BMZ Injection. Patient acknowledged and agree to plan.  Administrations This Visit    betamethasone acetate-betamethasone sodium phosphate (CELESTONE) injection 12 mg    Admin Date 07/17/2020 Action Given Dose 12 mg Route Intramuscular Administered By Tamela Oddi, RMA

## 2020-07-18 ENCOUNTER — Ambulatory Visit (INDEPENDENT_AMBULATORY_CARE_PROVIDER_SITE_OTHER): Payer: Medicaid Other

## 2020-07-18 DIAGNOSIS — O4102X0 Oligohydramnios, second trimester, not applicable or unspecified: Secondary | ICD-10-CM | POA: Diagnosis not present

## 2020-07-18 MED ORDER — BETAMETHASONE SOD PHOS & ACET 6 (3-3) MG/ML IJ SUSP
12.0000 mg | Freq: Once | INTRAMUSCULAR | Status: AC
  Administered 2020-07-18: 12 mg via INTRAMUSCULAR

## 2020-07-18 NOTE — Progress Notes (Addendum)
Subjective:  Frances Pearson is a 35 y.o. female here for BMZ Injection.   Objective:  LMP 03/07/2020   Appearance alert, well appearing, and in no distress.  Plan:  2 ml Betamethasone given in RG, tolerated well.  Keep OB appointment.   Administrations This Visit    betamethasone acetate-betamethasone sodium phosphate (CELESTONE) injection 12 mg    Admin Date 07/18/2020 Action Given Dose 12 mg Route Intramuscular Administered By Tamela Oddi, RMA         Patient was assessed and managed by nursing staff during this encounter. I have reviewed the chart and agree with the documentation and plan. I have also made any necessary editorial changes.  Baltazar Najjar, MD 07/19/2020 8:03 AM

## 2020-07-20 ENCOUNTER — Other Ambulatory Visit: Payer: Self-pay | Admitting: Obstetrics and Gynecology

## 2020-07-20 ENCOUNTER — Ambulatory Visit: Payer: Medicaid Other | Admitting: *Deleted

## 2020-07-20 ENCOUNTER — Other Ambulatory Visit: Payer: Self-pay | Admitting: *Deleted

## 2020-07-20 ENCOUNTER — Encounter: Payer: Self-pay | Admitting: *Deleted

## 2020-07-20 ENCOUNTER — Other Ambulatory Visit: Payer: Self-pay

## 2020-07-20 ENCOUNTER — Ambulatory Visit (HOSPITAL_BASED_OUTPATIENT_CLINIC_OR_DEPARTMENT_OTHER): Payer: Medicaid Other

## 2020-07-20 ENCOUNTER — Ambulatory Visit: Payer: Medicaid Other | Attending: Obstetrics | Admitting: Obstetrics

## 2020-07-20 VITALS — BP 139/50 | HR 105

## 2020-07-20 DIAGNOSIS — O4292 Full-term premature rupture of membranes, unspecified as to length of time between rupture and onset of labor: Secondary | ICD-10-CM

## 2020-07-20 DIAGNOSIS — O4102X0 Oligohydramnios, second trimester, not applicable or unspecified: Secondary | ICD-10-CM

## 2020-07-20 DIAGNOSIS — D259 Leiomyoma of uterus, unspecified: Secondary | ICD-10-CM

## 2020-07-20 DIAGNOSIS — Z3A23 23 weeks gestation of pregnancy: Secondary | ICD-10-CM

## 2020-07-20 DIAGNOSIS — O42112 Preterm premature rupture of membranes, onset of labor more than 24 hours following rupture, second trimester: Secondary | ICD-10-CM | POA: Diagnosis not present

## 2020-07-20 DIAGNOSIS — Z148 Genetic carrier of other disease: Secondary | ICD-10-CM

## 2020-07-20 DIAGNOSIS — O3412 Maternal care for benign tumor of corpus uteri, second trimester: Secondary | ICD-10-CM | POA: Diagnosis not present

## 2020-07-20 DIAGNOSIS — O365920 Maternal care for other known or suspected poor fetal growth, second trimester, not applicable or unspecified: Secondary | ICD-10-CM | POA: Diagnosis not present

## 2020-07-20 DIAGNOSIS — O283 Abnormal ultrasonic finding on antenatal screening of mother: Secondary | ICD-10-CM

## 2020-07-20 DIAGNOSIS — O10912 Unspecified pre-existing hypertension complicating pregnancy, second trimester: Secondary | ICD-10-CM | POA: Insufficient documentation

## 2020-07-20 DIAGNOSIS — O358XX0 Maternal care for other (suspected) fetal abnormality and damage, not applicable or unspecified: Secondary | ICD-10-CM

## 2020-07-20 DIAGNOSIS — O10013 Pre-existing essential hypertension complicating pregnancy, third trimester: Secondary | ICD-10-CM

## 2020-07-20 DIAGNOSIS — O99332 Smoking (tobacco) complicating pregnancy, second trimester: Secondary | ICD-10-CM

## 2020-07-20 NOTE — Progress Notes (Signed)
MFM Note  This patient was seen for a follow up exam due to PPROM at 19 weeks (about 1 month ago).  The patient has already been counseled previously regarding the increased risks of pulmonary hypoplasia and stillbirth due to PPROM at such an early gestational age.  She reports that she continues to leak amniotic fluid.  She denies any fevers or shaking chills or foul-smelling discharge.  She reports feeling fetal movements on a daily basis.  The patient has already received a complete course of antenatal corticosteroids.  She has declined inpatient admission as she said that she has too many things to do and there is no one else to take care of of her other child.  She was informed that the fetal growth measures at the 4th percentile for her gestational age indicating fetal growth restriction.  Anhydramnios continues to be noted today.  Doppler studies of the umbilical arteries performed today continues to show normal forward flow.  There were no signs of absent or reversed end-diastolic flow noted.  The patient and her partner were advised that due to PPROM, my recommendation would be for her to be hospitalized with daily fetal testing until delivery.  They continue to decline admission to the hospital.    The increased risk of stillbirth was discussed again.  She was advised to continue to monitor fetal movements on a daily basis and to go to the hospital should she complain of decreased fetal movements, lower abdominal cramping, should she have foul-smelling vaginal discharge, or if she has fevers or shaking chills.  Due to fetal growth restriction, a follow-up umbilical artery Doppler study was scheduled in 2 weeks.  We will reassess the fetal growth again in 3 weeks.  A total of 15 minutes was spent counseling and coordinating the care for this patient.  Greater than 50% of the time spent in direct face-to-face contact.

## 2020-07-21 ENCOUNTER — Other Ambulatory Visit: Payer: Self-pay

## 2020-07-21 ENCOUNTER — Inpatient Hospital Stay (HOSPITAL_COMMUNITY)
Admission: EM | Admit: 2020-07-21 | Discharge: 2020-07-22 | DRG: 806 | Disposition: A | Discharge status: Home / Self Care | Payer: Medicaid Other | Attending: Obstetrics and Gynecology | Admitting: Obstetrics and Gynecology

## 2020-07-21 ENCOUNTER — Encounter: Payer: Self-pay | Admitting: Obstetrics and Gynecology

## 2020-07-21 DIAGNOSIS — Z20822 Contact with and (suspected) exposure to covid-19: Secondary | ICD-10-CM | POA: Diagnosis present

## 2020-07-21 DIAGNOSIS — O42912 Preterm premature rupture of membranes, unspecified as to length of time between rupture and onset of labor, second trimester: Principal | ICD-10-CM | POA: Diagnosis present

## 2020-07-21 DIAGNOSIS — O365920 Maternal care for other known or suspected poor fetal growth, second trimester, not applicable or unspecified: Secondary | ICD-10-CM | POA: Diagnosis present

## 2020-07-21 DIAGNOSIS — Z3A23 23 weeks gestation of pregnancy: Secondary | ICD-10-CM

## 2020-07-21 DIAGNOSIS — N939 Abnormal uterine and vaginal bleeding, unspecified: Secondary | ICD-10-CM

## 2020-07-21 DIAGNOSIS — O365930 Maternal care for other known or suspected poor fetal growth, third trimester, not applicable or unspecified: Secondary | ICD-10-CM | POA: Diagnosis not present

## 2020-07-21 DIAGNOSIS — O4102X0 Oligohydramnios, second trimester, not applicable or unspecified: Secondary | ICD-10-CM

## 2020-07-21 DIAGNOSIS — O8612 Endometritis following delivery: Secondary | ICD-10-CM | POA: Diagnosis present

## 2020-07-21 DIAGNOSIS — O4202 Full-term premature rupture of membranes, onset of labor within 24 hours of rupture: Secondary | ICD-10-CM

## 2020-07-21 DIAGNOSIS — O119 Pre-existing hypertension with pre-eclampsia, unspecified trimester: Secondary | ICD-10-CM

## 2020-07-21 DIAGNOSIS — O139 Gestational [pregnancy-induced] hypertension without significant proteinuria, unspecified trimester: Secondary | ICD-10-CM | POA: Diagnosis present

## 2020-07-21 LAB — CBC
HCT: 38.3 % (ref 36.0–46.0)
Hemoglobin: 13.7 g/dL (ref 12.0–15.0)
MCH: 30 pg (ref 26.0–34.0)
MCHC: 35.8 g/dL (ref 30.0–36.0)
MCV: 84 fL (ref 80.0–100.0)
Platelets: 147 10*3/uL — ABNORMAL LOW (ref 150–400)
RBC: 4.56 MIL/uL (ref 3.87–5.11)
RDW: 15 % (ref 11.5–15.5)
WBC: 27.8 10*3/uL — ABNORMAL HIGH (ref 4.0–10.5)
nRBC: 0.1 % (ref 0.0–0.2)

## 2020-07-21 LAB — RESP PANEL BY RT-PCR (FLU A&B, COVID) ARPGX2
Influenza A by PCR: NEGATIVE
Influenza B by PCR: NEGATIVE
SARS Coronavirus 2 by RT PCR: NEGATIVE

## 2020-07-21 LAB — TYPE AND SCREEN
ABO/RH(D): O POS
Antibody Screen: NEGATIVE

## 2020-07-21 MED ORDER — BENZOCAINE-MENTHOL 20-0.5 % EX AERO: 1.0000 "application " | INHALATION_SPRAY | CUTANEOUS | Status: DC | PRN

## 2020-07-21 MED ORDER — WITCH HAZEL-GLYCERIN EX PADS: 1.0000 "application " | MEDICATED_PAD | CUTANEOUS | Status: DC | PRN

## 2020-07-21 MED ORDER — COCONUT OIL OIL: 1.0000 "application " | TOPICAL_OIL | Status: DC | PRN

## 2020-07-21 MED ORDER — SIMETHICONE 80 MG PO CHEW: 80.0000 mg | CHEWABLE_TABLET | ORAL | Status: DC | PRN

## 2020-07-21 MED ORDER — AMLODIPINE BESYLATE 5 MG PO TABS
5.0000 mg | ORAL_TABLET | Freq: Every day | ORAL | Status: DC
  Filled 2020-07-21: qty 1

## 2020-07-21 MED ORDER — LACTATED RINGERS IV BOLUS: 1000.0000 mL | Freq: Once | INTRAVENOUS | Status: DC

## 2020-07-21 MED ORDER — IBUPROFEN 600 MG PO TABS
600.0000 mg | ORAL_TABLET | Freq: Four times a day (QID) | ORAL | Status: DC
  Administered 2020-07-21 – 2020-07-22 (×3): 600 mg via ORAL
  Filled 2020-07-21 (×4): qty 1

## 2020-07-21 MED ORDER — ONDANSETRON HCL 4 MG/2ML IJ SOLN: 4.0000 mg | INTRAMUSCULAR | Status: DC | PRN

## 2020-07-21 MED ORDER — DIBUCAINE (PERIANAL) 1 % EX OINT: 1.0000 "application " | TOPICAL_OINTMENT | CUTANEOUS | Status: DC | PRN

## 2020-07-21 MED ORDER — OXYCODONE HCL 5 MG PO TABS: 5.0000 mg | ORAL_TABLET | Freq: Four times a day (QID) | ORAL | Status: DC | PRN

## 2020-07-21 MED ORDER — ACETAMINOPHEN 325 MG PO TABS
650.0000 mg | ORAL_TABLET | ORAL | Status: DC | PRN
  Administered 2020-07-21: 650 mg via ORAL
  Filled 2020-07-21: qty 2

## 2020-07-21 MED ORDER — TETANUS-DIPHTH-ACELL PERTUSSIS 5-2.5-18.5 LF-MCG/0.5 IM SUSY: 0.5000 mL | PREFILLED_SYRINGE | Freq: Once | INTRAMUSCULAR | Status: DC

## 2020-07-21 MED ORDER — DIPHENHYDRAMINE HCL 25 MG PO CAPS: 25.0000 mg | ORAL_CAPSULE | Freq: Four times a day (QID) | ORAL | Status: DC | PRN

## 2020-07-21 MED ORDER — ONDANSETRON HCL 4 MG PO TABS: 4.0000 mg | ORAL_TABLET | ORAL | Status: DC | PRN

## 2020-07-21 NOTE — Progress Notes (Signed)
Situation: Follow-up visit for pt Frances Pearson following delivery and death of infant child. Referral from pt/family.  Background: Facts: See previous note. [name] that the baby had unexpectedly been delivered while [name] was taking a shower earlier today. Family: [name] (mother and pt) and [name] (father) were present along with their 34 year old daughter, who had also been present when family had been told the news that the child had not survived. The remains of the child were also in the room for the visit; the parents took turns holding him. [name] spoke to his father via phone, who offered support and prayer. The 76 year old daughter watched videos on her phone throughout this visit. Feelings: Family continues to be tearful and now smiling intermittently. [name] asked "How am I supposed to feel?" and "What are we supposed to do now?" and also stated that "it doesn't seem real... it's like a nightmare" as well as "we didn't even get a chance to be with him... he didn't do anything." Mostly, the family was quiet. [name], "I can't stop looking at him," describing how the child's face looked to his father. Parents also shared "we were so excited" and shared about their daughter, "she was ready to be a big sister." Faith: [name] that he named their son "Pauline Good," because "he went straight to heaven."  Actions & Assessments: Chaplain offered grief support and education, sharing that there is no particular 'right way' to feel--that people experience and express grief differently. This chaplain encouraged the family to meet one another in each other's grief and know that it will change from day to day. This chaplain also encouraged the family to "remember Royalty how you want to," suggesting they speak to a pastor about a [employer].  Recommendations: For Chaplains: Continue to provide grief support to family, being aware of the unique experiences for  each mother, father, and big sister. It may be beneficial to help the family think through rituals and ways to remember and memorialize Royalty. For Staff: Chaplain remains available for follow-up spiritual/emotional support as needed.  Rev. Susanne Borders, MDiv     07/21/20 2000  Clinical Encounter Type  Visited With Patient and family together  Visit Type Follow-up;Death;Spiritual support  Referral From Family  Spiritual Encounters  Spiritual Needs Emotional;Grief support
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
## 2020-07-21 NOTE — Progress Notes (Signed)
Received call from [facility] ED. Pt is 23-[redacted] weeks gestation, delivered the baby at home, and has it in her arms. Acey Lav RNC  and Katrinka Blazing RN RNC  Instructed to call the NICU team to meet in the [facility] ED. 1635 I arrived in the [place] ED. NICU was already there and resusitation of the baby had been started. I went to room 2  to to assist with the Mother. Dr. Ilda Basset was at the bedside waiting to deliver the placenta. 1639, orders received from Dr. Ilda Basset to give 10 units of IM Pitocin. 10 units of pitocin given IM in left upper thigh. 1641 the placenta was delivered. 1645, IV was started in rt hand with a #20 angiocath by Lucina Mellow. CBC and Type and screen were drawn and sent to lab. 1020ml of LR was hung and infusing at a bolus rate. Dr. Ilda Basset did not want IV pitocin hung because the pt had received IM pitocin already. 1700 IV LR was decreased to a rate of 171ml/hr. See recovery flow sheet. Pt and father of the baby holding infant and crying at times. Chaplain in to talk with the pt and her family. 1800, pt transferred by stretcher to [facility] room 114. 1815, pt in [facility] and report was given to pt's RN.

## 2020-07-21 NOTE — ED Triage Notes (Signed)
Mom arrived to pediatric ED with 24 week newly delivered baby in arms. She stated she just gave birth at home 8 minutes okay and had not delivered placenta.    Patient taken to room, vs checked. [facility] emergency team called

## 2020-07-21 NOTE — Consult Note (Signed)
Rapid response called to pediatric ED for patient delivered estimated [redacted] weeks gestation and receiving resuscitation.  Arrived at 8 minutes on apgar time with infant receiving chest compressions and PPV via bag and mask, asystolic upon auscultation. History provided at that time was that infant was estimated to be 23 weeks, IUGR, PPROM since 19 weeks.  Vocal cords visualized and infant intubated on first attempt with equal breath sounds auscultated but no change on C02 detector.  Dr. Katherina Mires arrived at that time and attempted to determine if ET tube was in appropriate placement.  ET tube removed and replaced with equal breath sounds but no color change on C02 detector.  At that time 0.5 mL ET epinephrine was administered using an estimated weight of 0.5 kg and a dose of 1 mL/kg.  UVC placed by Arliss Journey and 1.12 mL of IV epinephrine was administered again using an estimated weight of 0.5kg and dose of 0.15 mL/kg.  Infant remained asystolic.  Further history provided at that time that infant delivered was delivered at home and transported to [facility] ED without resuscitation. Mother is G2P1 with PPROM since 98 weeks, known IUGR, received bethamethsone x 2.  Followed by MFM with recommendations for hospitalization at 23 weeks with daily monitoring.  Mother declined hospitalization due to child care constraints.   Resuscitation begun upon arrival with chest compressions and PPV.  Actual time of intubation was more approximately 20 and 22 minutes of life versus reading on apgar time.  Upon receiving this information, decision was made to discontinue resuscitative efforts.  Upon exam, no gross anomalies visualized, eye open, skin intact.  Infant was noted to have large fluctuant mass over coronal scalp that continued to extend during resuscitation.  Dr. Katherina Mires updated mother regarding infant's disposition.  FOB brought to ER room to hold infant.  NICU team left at that time.
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
## 2020-07-21 NOTE — Progress Notes (Signed)
Situation: Initial visit for pt Frances Pearson; chaplain responding to page from [facility] ED.  Background: Facts: Per RN, Ms. Terrence had delivered her child at 38 weeks, and the child had not survived. Family: Family present included father of the child, Frances Pearson, and another family member. Feelings: Family expressed emotions ranging from frustration and requesting the doctor to "keep working on my baby" to grief and tears as they took pictures with the child. Mr. Frances Pearson also asked, "Did this happen because of something we did or didn't do?" Faith: Family also stated "he's gone straight to heaven," and were open to prayer at this time.  Actions & Assessments: Chaplain provided compassionate presence, hospitality, prayer, and grief support.  Recommendations: For Chaplains: Continue to provide grief support to Ms. Antoria and family, especially being wary of any feelings of guilt per Mr. Baxter Flattery question of 'why this happened'. For Staff: Continue to offer appropriate grief support as Ms. Varsha and family process this loss. Chaplain services remain available for follow-up spiritual/emotional support as needed.  Rev. Susanne Borders, MDiv      07/21/20 1800  Clinical Encounter Type  Visited With Patient and family together  Visit Type Initial;ED;Death;Spiritual support  Referral From Nurse  Spiritual Encounters  Spiritual Needs Prayer;Grief support;Emotional

## 2020-07-22 ENCOUNTER — Encounter (HOSPITAL_COMMUNITY): Payer: Self-pay | Admitting: Obstetrics and Gynecology

## 2020-07-22 LAB — CBC
HCT: 33.8 % — ABNORMAL LOW (ref 36.0–46.0)
Hemoglobin: 12.2 g/dL (ref 12.0–15.0)
MCH: 30 pg (ref 26.0–34.0)
MCHC: 36.1 g/dL — ABNORMAL HIGH (ref 30.0–36.0)
MCV: 83.3 fL (ref 80.0–100.0)
Platelets: 239 10*3/uL (ref 150–400)
RBC: 4.06 MIL/uL (ref 3.87–5.11)
RDW: 13.5 % (ref 11.5–15.5)
WBC: 36.5 10*3/uL — ABNORMAL HIGH (ref 4.0–10.5)
nRBC: 0.1 % (ref 0.0–0.2)

## 2020-07-22 LAB — COMPREHENSIVE METABOLIC PANEL
ALT: 27 U/L (ref 0–44)
AST: 23 U/L (ref 15–41)
Albumin: 2.9 g/dL — ABNORMAL LOW (ref 3.5–5.0)
Alkaline Phosphatase: 65 U/L (ref 38–126)
Anion gap: 7 (ref 5–15)
BUN: 9 mg/dL (ref 6–20)
CO2: 22 mmol/L (ref 22–32)
Calcium: 9 mg/dL (ref 8.9–10.3)
Chloride: 106 mmol/L (ref 98–111)
Creatinine, Ser: 0.73 mg/dL (ref 0.44–1.00)
GFR, Estimated: >60 mL/min (ref >60)
Glucose, Bld: 101 mg/dL — ABNORMAL HIGH (ref 70–99)
Potassium: 3.8 mmol/L (ref 3.5–5.1)
Sodium: 135 mmol/L (ref 135–145)
Total Bilirubin: 0.2 mg/dL — ABNORMAL LOW (ref 0.3–1.2)
Total Protein: 6.2 g/dL — ABNORMAL LOW (ref 6.5–8.1)

## 2020-07-22 LAB — RPR: RPR Ser Ql: NONREACTIVE

## 2020-07-22 MED ORDER — IBUPROFEN 600 MG PO TABS: 600.0000 mg | ORAL_TABLET | Freq: Four times a day (QID) | ORAL | 0 refills | Status: AC | PRN

## 2020-07-22 MED ORDER — AMPICILLIN-SULBACTAM SODIUM 3 (2-1) G IJ SOLR
3.0000 g | Freq: Four times a day (QID) | INTRAMUSCULAR | Status: AC
Start: 2020-07-22 — End: 2020-07-22
  Administered 2020-07-22 (×2): 3 g via INTRAVENOUS
  Filled 2020-07-22 (×2): qty 8

## 2020-07-22 MED ORDER — SODIUM CHLORIDE 0.9 % IV SOLN: INTRAVENOUS | Status: DC | PRN

## 2020-07-22 NOTE — Discharge Instructions (Signed)
Hypertension During Pregnancy Hypertension is also called high blood pressure. High blood pressure means that the force of your blood moving in your body is too strong. It can cause problems for you and your baby. Different types of high blood pressure can happen during pregnancy. The types are:  High blood pressure before you got pregnant. This is called chronic hypertension.  This can continue during your pregnancy. Your doctor will want to keep checking your blood pressure. You may need medicine to keep your blood pressure under control while you are pregnant. You will need follow-up visits after you have your baby.  High blood pressure that goes up during pregnancy when it was normal before. This is called gestational hypertension. It will usually get better after you have your baby, but your doctor will need to watch your blood pressure to make sure that it is getting better.  Very high blood pressure during pregnancy. This is called preeclampsia. Very high blood pressure is an emergency that needs to be checked and treated right away.  You may develop very high blood pressure after giving birth. This is called postpartum preeclampsia. This usually occurs within 48 hours after childbirth but may occur up to 6 weeks after giving birth. This is rare. How does this affect me? If you have high blood pressure during pregnancy, you have a higher chance of developing high blood pressure:  As you get older.  If you get pregnant again. In some cases, high blood pressure during pregnancy can cause:  Stroke.  Heart attack.  Damage to the kidneys, lungs, or liver.  Preeclampsia.  Jerky movements you cannot control (convulsions or seizures).  Problems with the placenta.   What can I do to lower my risk?   Keep a healthy weight.  Eat a healthy diet.  Follow what your doctor tells you about treating any medical problems that you had before becoming pregnant. It is very important to go to  all of your doctor visits. Your doctor will check your blood pressure and make sure that your pregnancy is progressing as it should. Treatment should start early if a problem is found.   Follow these instructions at home:  Take your blood pressure 1-2 times per day. Call the office if your blood pressure is 155 or higher for the top number or 105 or higher for the bottom number.    Eating and drinking   Drink enough fluid to keep your pee (urine) pale yellow.  Avoid caffeine. Lifestyle  Do not use any products that contain nicotine or tobacco, such as cigarettes, e-cigarettes, and chewing tobacco. If you need help quitting, ask your doctor.  Do not use alcohol or drugs.  Avoid stress.  Rest and get plenty of sleep.  Regular exercise can help. Ask your doctor what kinds of exercise are best for you. General instructions  Take over-the-counter and prescription medicines only as told by your doctor.  Keep all prenatal and follow-up visits as told by your doctor. This is important. Contact a doctor if:  You have symptoms that your doctor told you to watch for, such as: ? Headaches. ? Nausea. ? Vomiting. ? Belly (abdominal) pain. ? Dizziness. ? Light-headedness. Get help right away if:  You have: ? Very bad belly pain that does not get better with treatment. ? A very bad headache that does not get better. ? Vomiting that does not get better. ? Sudden, fast weight gain. ? Sudden swelling in your hands, ankles, or face. ?  Blood in your pee. ? Blurry vision. ? Double vision. ? Shortness of breath. ? Chest pain. ? Weakness on one side of your body. ? Trouble talking. Summary  High blood pressure is also called hypertension.  High blood pressure means that the force of your blood moving in your body is too strong.  High blood pressure can cause problems for you and your baby.  Keep all follow-up visits as told by your doctor. This is important. This information is  not intended to replace advice given to you by your health care provider. Make sure you discuss any questions you have with your health care provider. Document Released: 03/08/2010 Document Revised: 05/27/2018 Document Reviewed: 03/02/2018 Elsevier Patient Education  2020 [employer].

## 2020-07-22 NOTE — Progress Notes (Addendum)
Fetus weighs 500 grams (1 pound, 1.6 ounces.)

## 2020-07-22 NOTE — Progress Notes (Signed)
CSW acknowledge consult in regards to infant's passing. Per chart Mable Fill has already connected with MOB and family to offer support. If CSW is still needed please do not hesitate to contact.   Darcus Austin, MSW, LCSW-A Clinical Social Worker [phone]

## 2020-07-22 NOTE — Discharge Summary (Signed)
Postpartum Discharge Summary     Patient Name: Frances Pearson DOB: [birth year] MRN: [id number]  Date of admission: 07/21/2020 Delivery date: 07/21/2020 Delivering provider: Durene Romans MD  OBGYN Clinic: [facility] Date of discharge: 07/22/2020   Admitting diagnosis: Preterm labor and preterm birth at 23/6 at home   Secondary diagnosis: ?Chronic hypertension   Additional problems: PPROM at 19wks, patient declined admission at 70 weeks. Newly diagnosed FGR    Discharge diagnosis: Preterm Pregnancy Delivered. ?endometritis                                             Post partum procedures: None Complications: None  Hospital course: Patient presented to the Peds ED side after having PTL and delivery at home shortly before arrival. Newborn taken by peds care team. I was called for assessment and placenta was still in situ.  Placenta delivered by me w/o issue with IM pitocin given.  Per peds team, no heartbeat when baby was initially assessed by them.   WBC more elevated on PPD#1 but patient afebrile, fundus non tender but given her history, two doses of Unasyn was given for possible endometritis  Pre-eclampsia labs negative on evaluation and BPs normal on discharge w/o meds.  Magnesium Sulfate received: No BMZ received: Yes, on 5/31 and 6/1 Transfusion:Yes  Physical exam  Vitals:   07/21/20 1811 07/21/20 1929 07/22/20 0415  BP: (!) 144/75 133/79 130/71  Pulse: 97 99 91  Resp: 19    Temp:  98.1 F (36.7 C) 98 F (36.7 C)  TempSrc:  Oral Oral  SpO2:  98% 99%   General: alert Lochia: appropriate Uterine Fundus: nttp Incision: N/A  Labs: CBC Latest Ref Rng & Units 07/22/2020 07/21/2020 05/03/2020  WBC 4.0 - 10.5 K/uL 36.5(H) 27.8(H) 9.3  Hemoglobin 12.0 - 15.0 g/dL 12.2 13.7 13.8  Hematocrit 36.0 - 46.0 % 33.8(L) 38.3 39.4  Platelets 150 - 400 K/uL 239 147(L) 233   CMP Latest Ref Rng & Units 07/22/2020 05/03/2020 08/08/2018  Glucose 70 - 99 mg/dL 101(H) 82 110(H)  BUN 6 - 20  mg/dL $Remo'9 8 7  'UaMBR$ Creatinine 0.44 - 1.00 mg/dL 0.73 0.86 0.83  Sodium 135 - 145 mmol/L 135 137 136  Potassium 3.5 - 5.1 mmol/L 3.8 3.9 3.7  Chloride 98 - 111 mmol/L 106 101 108  CO2 22 - 32 mmol/L 22 19(L) 22  Calcium 8.9 - 10.3 mg/dL 9.0 9.5 9.3  Total Protein 6.5 - 8.1 g/dL 6.2(L) 6.8 7.2  Total Bilirubin 0.3 - 1.2 mg/dL 0.2(L) 0.2 0.6  Alkaline Phos 38 - 126 U/L 65 39(L) 36(L)  AST 15 - 41 U/L $Remo'23 27 21  'PKcNf$ ALT 0 - 44 U/L $Remo'27 16 16   'AOIAu$ Edinburgh Score: No flowsheet data found.    After visit meds:  Allergies as of 07/22/2020      Reactions   Penicillins Anaphylaxis, Hives   *okay with AMOXICILLIN*  Has patient had a PCN reaction causing immediate rash, facial/tongue/throat swelling, SOB or lightheadedness with hypotension: unknown Has patient had a PCN reaction causing severe rash involving mucus membranes or skin necrosis: unknown Has patient had a PCN reaction that required hospitalization: No  Has patient had a PCN reaction occurring within the last 10 years: Yes  If all of the above answers are "NO", then may proceed with Cephalosporin use. Added 05/07/20 'can't  Medication List    STOP taking these medications   aspirin EC 81 MG tablet     TAKE these medications   Blood Pressure Kit Devi 1 kit by Does not apply route once a week.   ibuprofen 600 MG tablet Commonly known as: ADVIL Take 1 tablet (600 mg total) by mouth every 6 (six) hours as needed.   multivitamin-prenatal 27-0.8 MG Tabs tablet Take 1 tablet by mouth daily at 12 noon.        Discharge home in stable condition  Infant [place] Discharge instruction: per After Visit Summary and Postpartum booklet. Activity: Advance as tolerated. Pelvic rest for 6 weeks.  Diet: routine diet  Anticipated Birth Control: not discussed with patient Postpartum Appointment:1 week Additional Postpartum F/U: Postpartum Depression checkup and BP check 1 week   Future Appointments: Future Appointments  Date  Time Provider [place]  07/25/2020  9:45 AM Shelly Bombard, MD [place] None  08/03/2020 11:00 AM [facility] NURSE [facility]  08/03/2020 11:15 AM [facility] US2 [facility]  08/09/2020  1:00 PM [facility] NURSE [facility]  08/09/2020  1:15 PM [facility] US2 [facility] [place]   Follow up Visit:  [place]. Call in 3 day(s).   Specialty: Obstetrics and Gynecology Why: if you have not heard about your blood pressure appointment for friday this upcoming week  Contact information: [address], [place] [address] [place] [phone]                  07/22/2020 Aletha Halim, MD

## 2020-07-23 MED FILL — Oxytocin Inj 10 Unit/ML: INTRAMUSCULAR | Qty: 1 | Status: AC

## 2020-07-23 MED FILL — Lactated Ringer's Solution: INTRAVENOUS | Qty: 1000 | Status: AC

## 2020-07-25 ENCOUNTER — Encounter: Payer: Medicaid Other | Admitting: Obstetrics

## 2020-07-27 ENCOUNTER — Encounter: Payer: Self-pay | Admitting: Obstetrics

## 2020-07-27 ENCOUNTER — Ambulatory Visit (INDEPENDENT_AMBULATORY_CARE_PROVIDER_SITE_OTHER): Payer: Medicaid Other | Admitting: Obstetrics

## 2020-07-27 ENCOUNTER — Other Ambulatory Visit: Payer: Self-pay

## 2020-07-27 ENCOUNTER — Ambulatory Visit: Payer: Medicaid Other | Admitting: Obstetrics

## 2020-07-27 VITALS — BP 131/84 | HR 86 | Wt 199.1 lb

## 2020-07-27 DIAGNOSIS — E669 Obesity, unspecified: Secondary | ICD-10-CM

## 2020-07-27 DIAGNOSIS — F320 Major depressive disorder, single episode, mild: Secondary | ICD-10-CM

## 2020-07-27 DIAGNOSIS — O039 Complete or unspecified spontaneous abortion without complication: Secondary | ICD-10-CM
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
## 2020-07-27 NOTE — Progress Notes (Signed)
Pt is in the office for pp BP check. BP is 131/84 Not taking any medications, denies abnormal symptoms. Pt reports stillbirth delivery on 07-21-20, wants to have a consult with provider today. Edinburgh score= 18

## 2020-08-03 ENCOUNTER — Other Ambulatory Visit: Payer: Medicaid Other

## 2020-08-03 ENCOUNTER — Ambulatory Visit: Payer: Medicaid Other

## 2020-08-06 ENCOUNTER — Encounter: Payer: Medicaid Other | Admitting: Licensed Clinical Social Worker

## 2020-08-09 ENCOUNTER — Ambulatory Visit: Payer: Medicaid Other

## 2020-08-15 ENCOUNTER — Telehealth: Payer: Self-pay

## 2020-08-15 NOTE — Telephone Encounter (Signed)
Return call to pt regarding vm Pt recently loss baby. I offered pt to have appt w/ Seth Bake and let her know it may be good that she speak with someone pt declines at this time  Pt denies any suicidal thoughts or depression. Pt states she is still caring for other child , cooking, reading, cleaning and taking care of home.

## 2020-08-23 ENCOUNTER — Ambulatory Visit (INDEPENDENT_AMBULATORY_CARE_PROVIDER_SITE_OTHER): Payer: Medicaid Other | Admitting: Obstetrics

## 2020-08-23 ENCOUNTER — Other Ambulatory Visit (HOSPITAL_COMMUNITY)
Admission: RE | Admit: 2020-08-23 | Discharge: 2020-08-23 | Disposition: A | Discharge status: Home / Self Care | Payer: Medicaid Other | Source: Ambulatory Visit | Attending: Obstetrics | Admitting: Obstetrics

## 2020-08-23 ENCOUNTER — Encounter: Payer: Self-pay | Admitting: Obstetrics

## 2020-08-23 ENCOUNTER — Other Ambulatory Visit: Payer: Self-pay

## 2020-08-23 VITALS — BP 136/89 | HR 77 | Wt 202.4 lb

## 2020-08-23 DIAGNOSIS — N898 Other specified noninflammatory disorders of vagina: Secondary | ICD-10-CM | POA: Insufficient documentation

## 2020-08-23 DIAGNOSIS — L680 Hirsutism: Secondary | ICD-10-CM | POA: Diagnosis not present

## 2020-08-23 DIAGNOSIS — O039 Complete or unspecified spontaneous abortion without complication: Secondary | ICD-10-CM | POA: Diagnosis not present

## 2020-08-23 DIAGNOSIS — Z8659 Personal history of other mental and behavioral disorders: Secondary | ICD-10-CM

## 2020-08-23 DIAGNOSIS — O99891 Other specified diseases and conditions complicating pregnancy: Secondary | ICD-10-CM
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
## 2020-08-24 LAB — TESTOSTERONE,FREE AND TOTAL
Testosterone, Free: 1.2 pg/mL (ref 0.0–4.2)
Testosterone: 30 ng/dL (ref 8–60)

## 2020-08-24 LAB — COMPREHENSIVE METABOLIC PANEL
ALT: 36 IU/L — ABNORMAL HIGH (ref 0–32)
AST: 31 IU/L (ref 0–40)
Albumin/Globulin Ratio: 1.7 (ref 1.2–2.2)
Albumin: 4.4 g/dL (ref 3.8–4.8)
Alkaline Phosphatase: 59 IU/L (ref 44–121)
BUN/Creatinine Ratio: 8 — ABNORMAL LOW (ref 9–23)
BUN: 7 mg/dL (ref 6–20)
Bilirubin Total: 0.3 mg/dL (ref 0.0–1.2)
CO2: 21 mmol/L (ref 20–29)
Calcium: 8.8 mg/dL (ref 8.7–10.2)
Chloride: 106 mmol/L (ref 96–106)
Creatinine, Ser: 0.88 mg/dL (ref 0.57–1.00)
Globulin, Total: 2.6 g/dL (ref 1.5–4.5)
Glucose: 88 mg/dL (ref 65–99)
Potassium: 4.2 mmol/L (ref 3.5–5.2)
Sodium: 141 mmol/L (ref 134–144)
Total Protein: 7 g/dL (ref 6.0–8.5)
eGFR: 88 mL/min/{1.73_m2} (ref >59)

## 2020-08-24 LAB — CERVICOVAGINAL ANCILLARY ONLY
Bacterial Vaginitis (gardnerella): NEGATIVE
Candida Glabrata: NEGATIVE
Candida Vaginitis: NEGATIVE
Chlamydia: NEGATIVE
Comment: NEGATIVE
Comment: NEGATIVE
Comment: NEGATIVE
Comment: NEGATIVE
Comment: NEGATIVE
Comment: NORMAL
Neisseria Gonorrhea: NEGATIVE
Trichomonas: NEGATIVE

## 2020-08-24 LAB — CBC
Hematocrit: 42.9 % (ref 34.0–46.6)
Hemoglobin: 14.2 g/dL (ref 11.1–15.9)
MCH: 28.9 pg (ref 26.6–33.0)
MCHC: 33.1 g/dL (ref 31.5–35.7)
MCV: 87 fL (ref 79–97)
Platelets: 240 10*3/uL (ref 150–450)
RBC: 4.91 x10E6/uL (ref 3.77–5.28)
RDW: 14.4 % (ref 11.7–15.4)
WBC: 7 10*3/uL (ref 3.4–10.8)

## 2020-08-30 ENCOUNTER — Ambulatory Visit (HOSPITAL_BASED_OUTPATIENT_CLINIC_OR_DEPARTMENT_OTHER)
Admission: RE | Admit: 2020-08-30 | Discharge: 2020-08-30 | Disposition: A | Discharge status: Home / Self Care | Payer: Medicaid Other | Source: Ambulatory Visit | Attending: Obstetrics | Admitting: Obstetrics

## 2020-08-30 ENCOUNTER — Other Ambulatory Visit: Payer: Self-pay

## 2020-08-30 DIAGNOSIS — O039 Complete or unspecified spontaneous abortion without complication: Secondary | ICD-10-CM | POA: Insufficient documentation

## 2020-09-06 ENCOUNTER — Encounter: Payer: Self-pay | Admitting: Obstetrics

## 2020-09-06 ENCOUNTER — Telehealth (INDEPENDENT_AMBULATORY_CARE_PROVIDER_SITE_OTHER): Payer: Medicaid Other | Admitting: Obstetrics

## 2020-09-06 DIAGNOSIS — O039 Complete or unspecified spontaneous abortion without complication: Secondary | ICD-10-CM

## 2020-09-06 DIAGNOSIS — L680 Hirsutism: Secondary | ICD-10-CM

## 2020-09-06 DIAGNOSIS — E669 Obesity, unspecified: Secondary | ICD-10-CM

## 2020-09-06 DIAGNOSIS — Z3169 Encounter for other general counseling and advice on procreation: Secondary | ICD-10-CM

## 2020-09-06 DIAGNOSIS — Z8759 Personal history of other complications of pregnancy, childbirth and the puerperium: Secondary | ICD-10-CM

## 2020-09-06 DIAGNOSIS — L700 Acne vulgaris: Secondary | ICD-10-CM

## 2020-09-06 MED ORDER — VITAFOL ULTRA 29-0.6-0.4-200 MG PO CAPS: 1.0000 | ORAL_CAPSULE | Freq: Every day | ORAL | 4 refills | Status: AC

## 2020-09-06 MED ORDER — CLINDAMYCIN PHOSPHATE 1 % EX SOLN: Freq: Two times a day (BID) | CUTANEOUS | 2 refills | Status: DC
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
## 2020-09-06 NOTE — Progress Notes (Signed)
Virtual  F/U SAB. Last visit was 08/23/20.

## 2020-12-20 ENCOUNTER — Other Ambulatory Visit: Payer: Self-pay | Admitting: Obstetrics

## 2020-12-20 DIAGNOSIS — L700 Acne vulgaris: Secondary | ICD-10-CM

## 2021-06-07 IMAGING — US US OB LIMITED
1 series · 5 of 5 positions shown · non-contrast
Comparison: none

[Series 1: us ob limited · 5 of 5 slices shown]
[im 1/5]
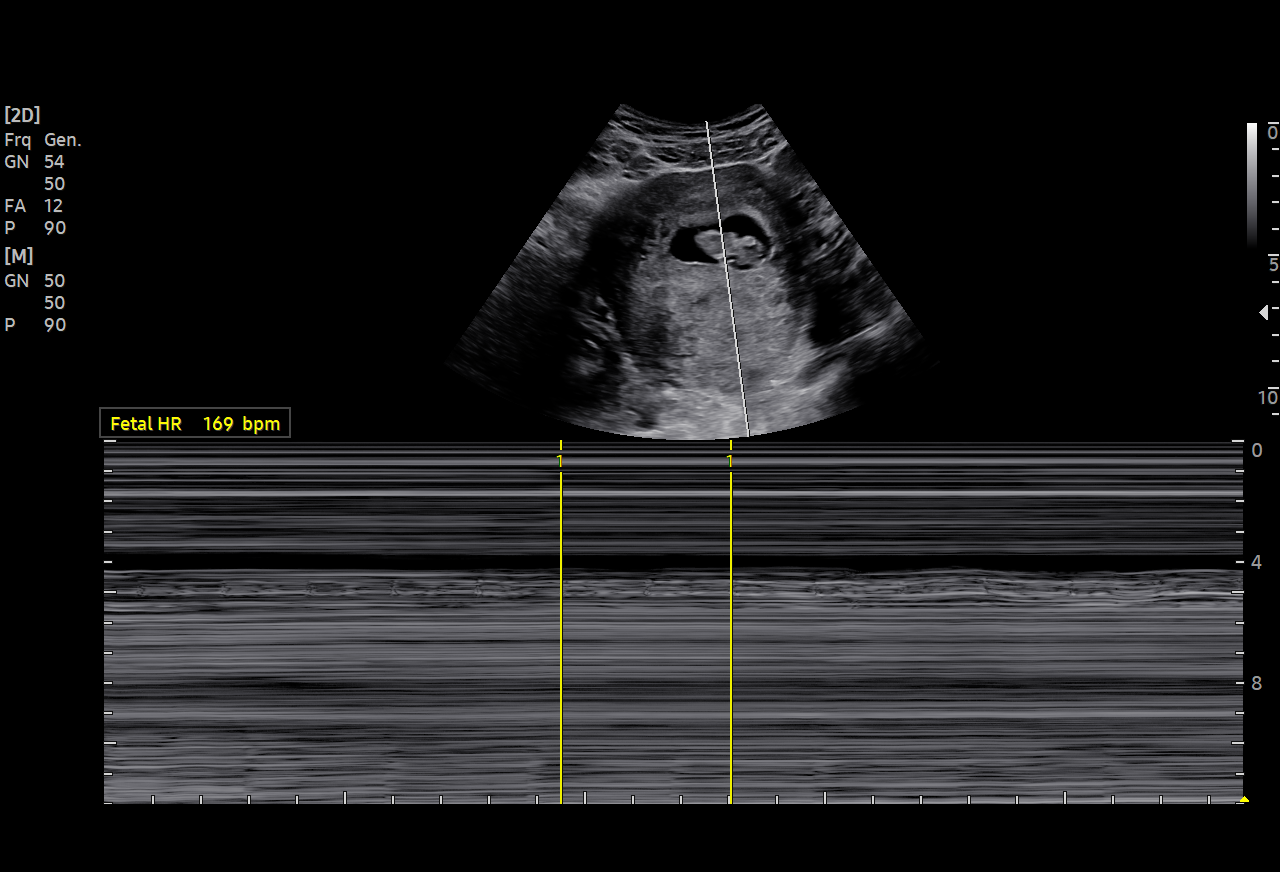
[im 2/5]
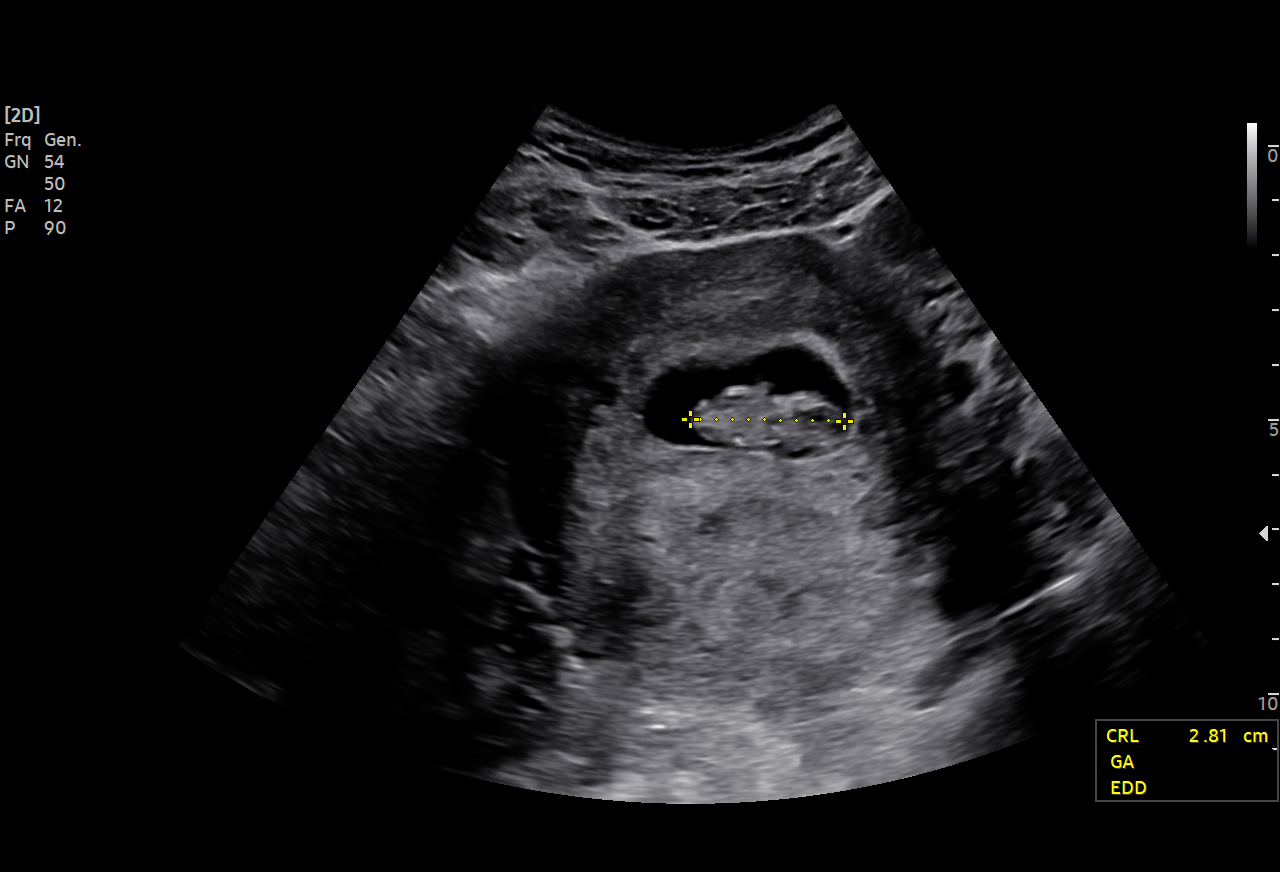
[im 3/5]
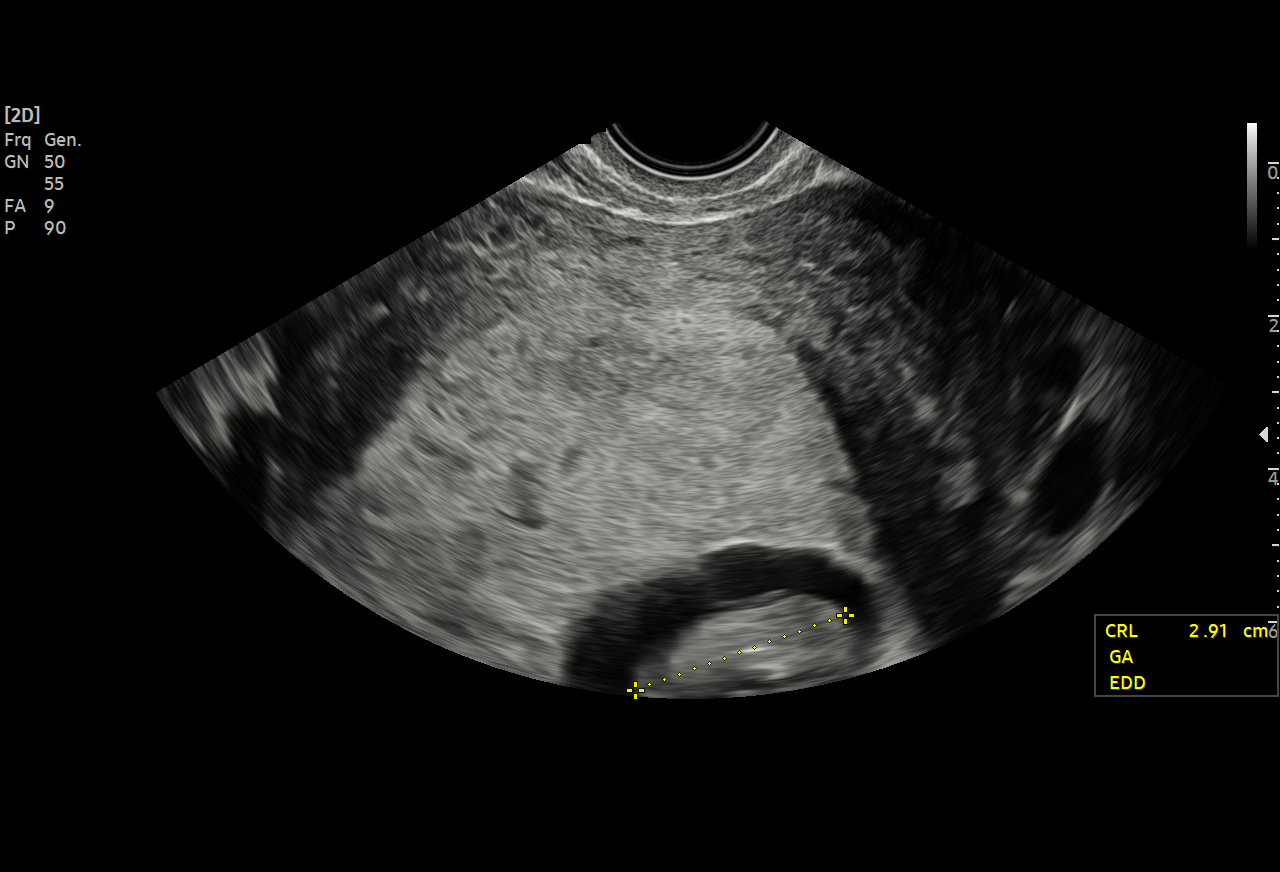
[im 4/5]
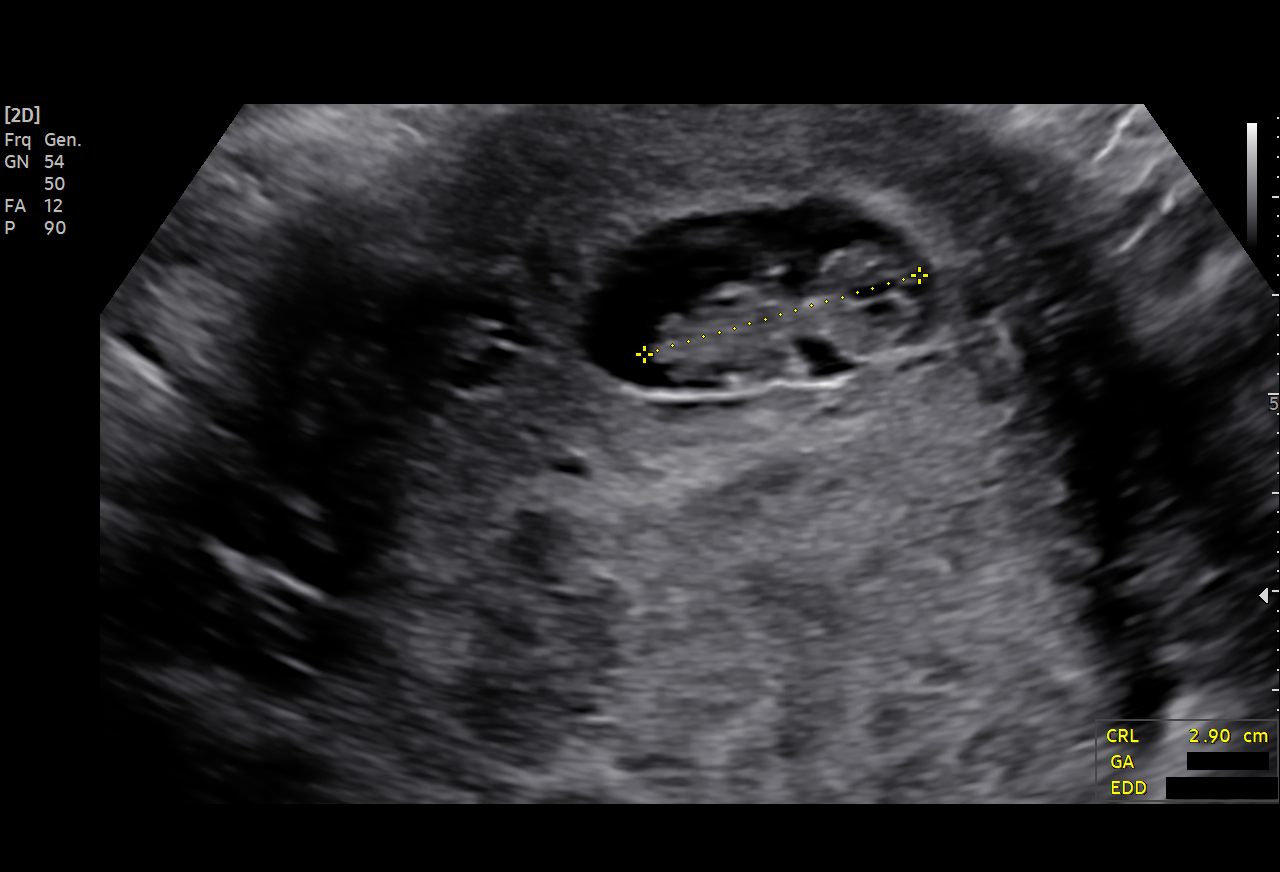
[im 5/5]
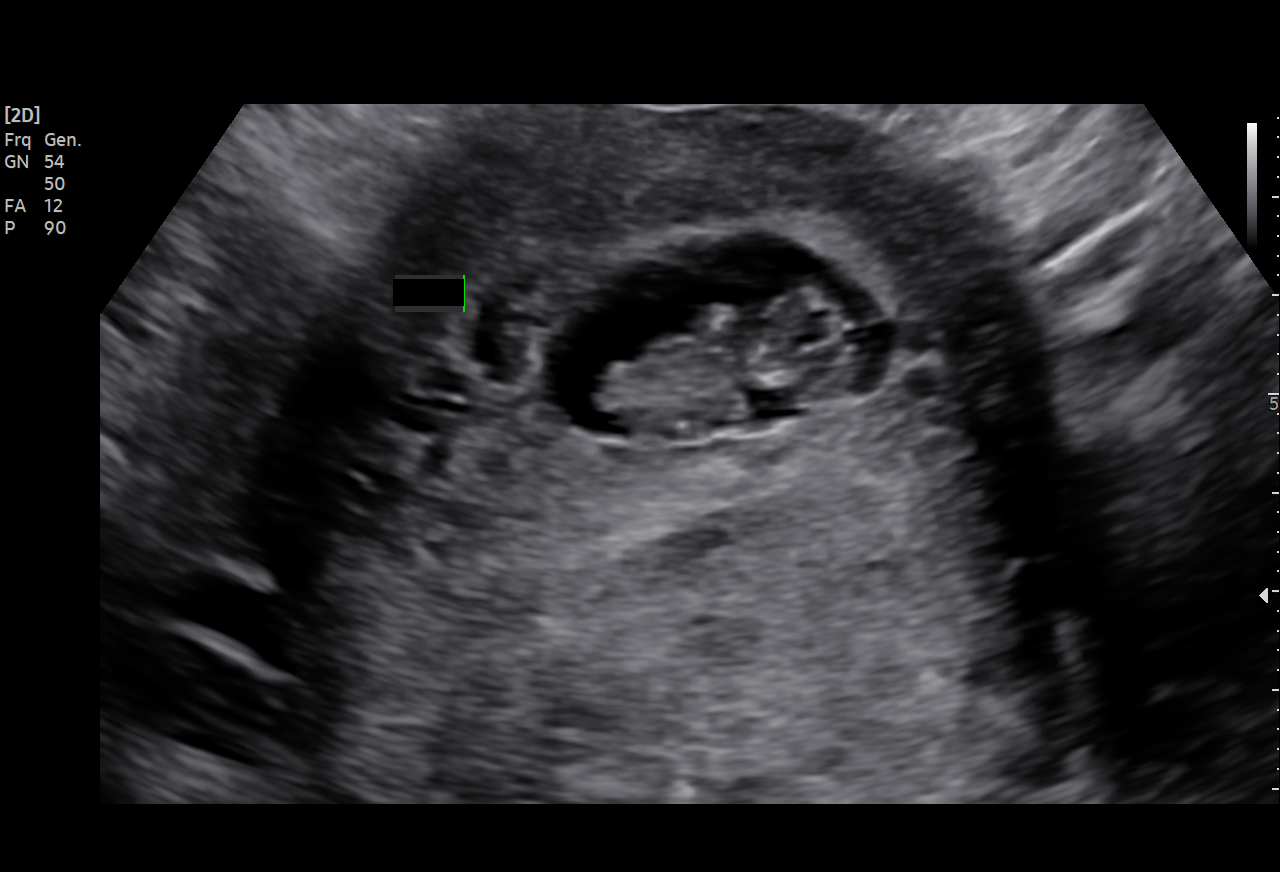

[5 of 5 positions shown; findings below may reference images not displayed]

Healthcare

 1   [HOSPITAL]                        [address].0      NOBUTAKE LIGHTY

Indications

 9 weeks gestation of pregnancy
Fetal Evaluation

 Num Of Fetuses:          1
 Fetal Heart Rate(bpm):   169
 Cardiac Activity:        Observed
Biometry

 CRL:      28.7   mm     G. Age:  9w 4d                    EDD:   11/11/20
Gestational Age

 Best:           9w 4d     Det. By:  U/S C R L (04/12/20)       EDD:  11/11/20
Comments

 Single live IUP at 9w4d. Patient Fairoz unsure about LMP.
Impression

 Viable intrauterine pregnancy
Recommendations

 Routine prenatal care
                 Meyer, Pako

## 2021-09-14 IMAGING — US US MFM OB FOLLOW-UP
1 series · 13 of 28 positions shown · non-contrast
Comparison: none

[Series 1: us mfm ob follow-up · 40 acquisitions, 13 frames shown]
[im 2/40]
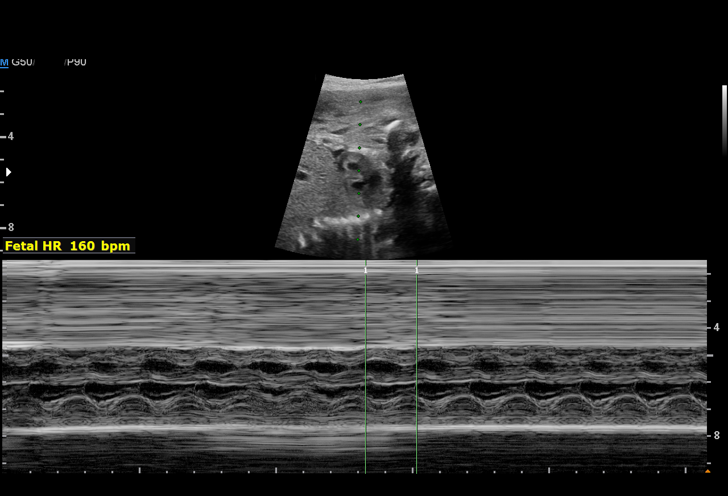
[im 5/40]
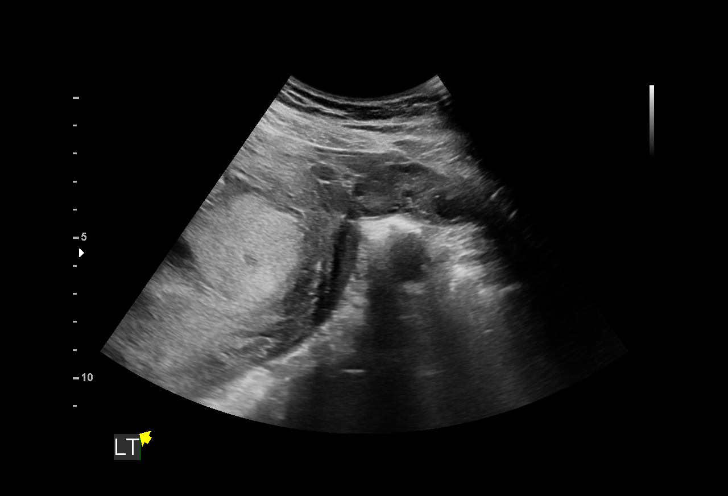
[im 8/40]
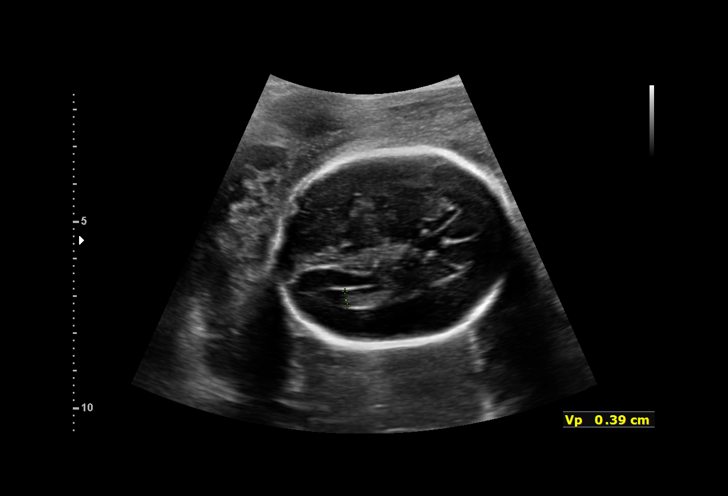
[im 11/40]
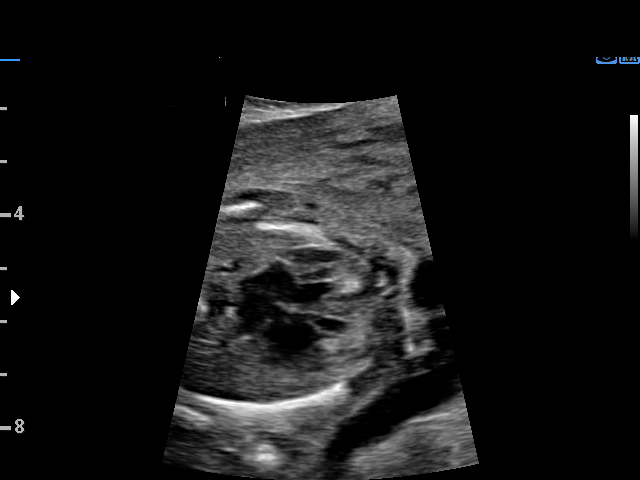
[im 14/40]
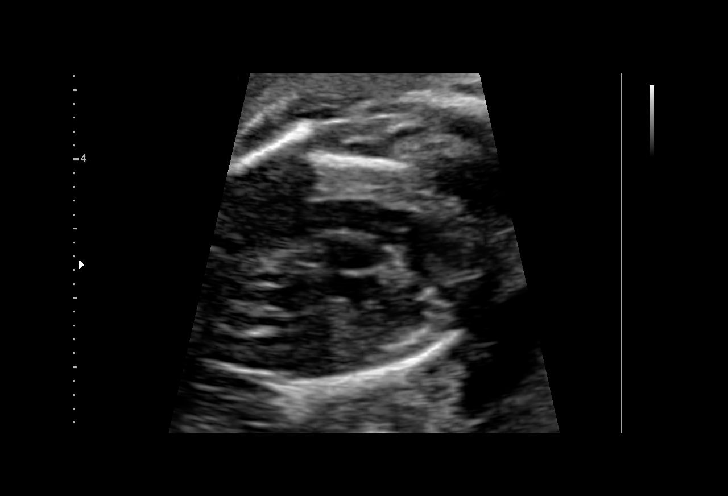
[im 16/40]
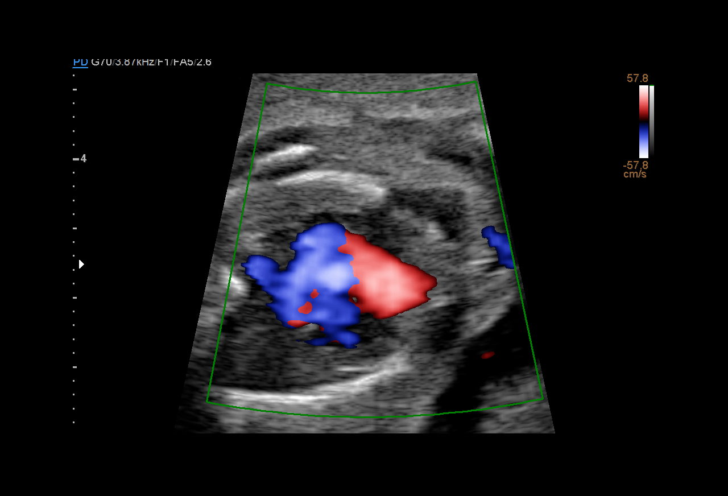
[im 21/40]
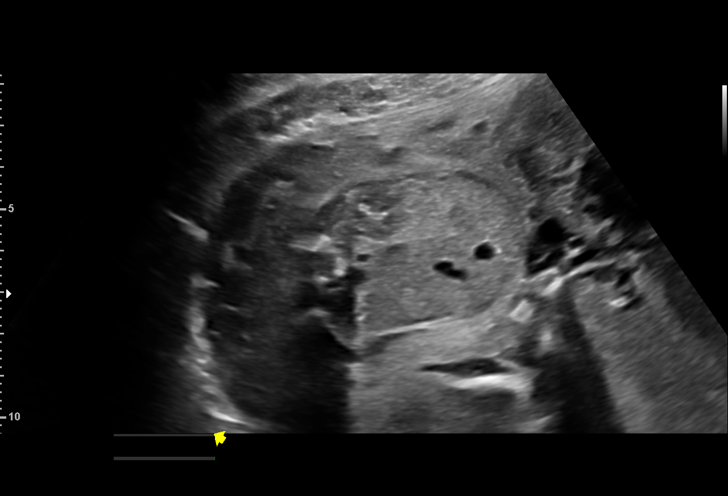
[im 24/40]
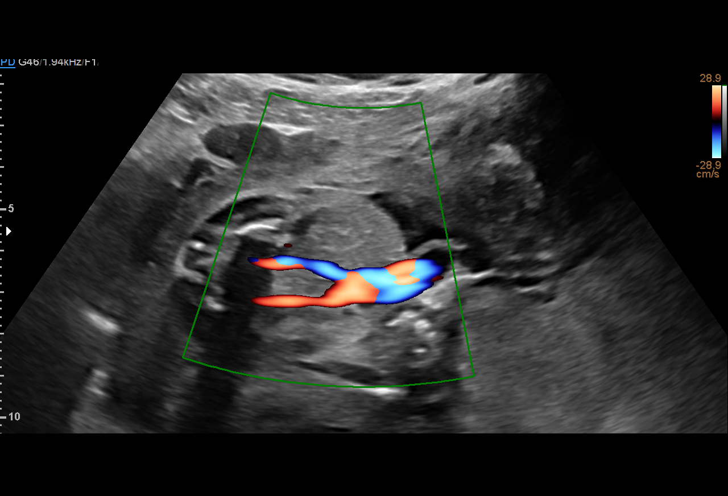
[im 27/40]
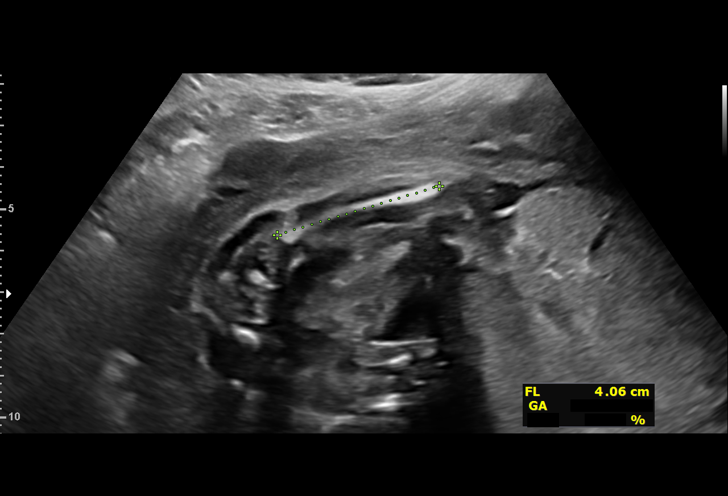
[im 29/40]
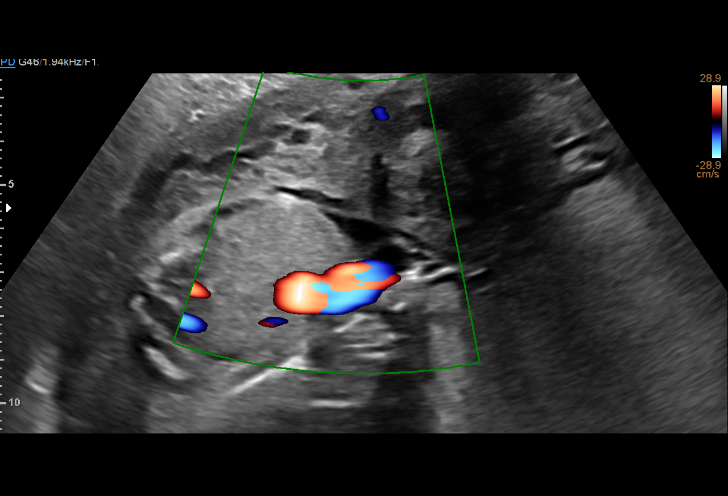
[im 32/40]
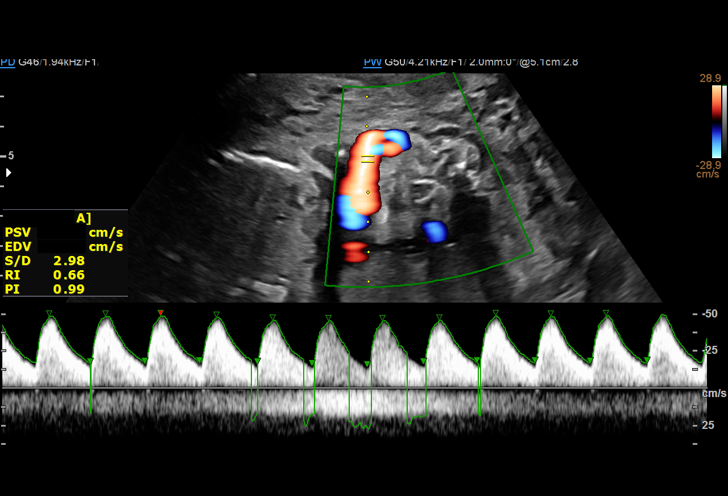
[im 35/40]
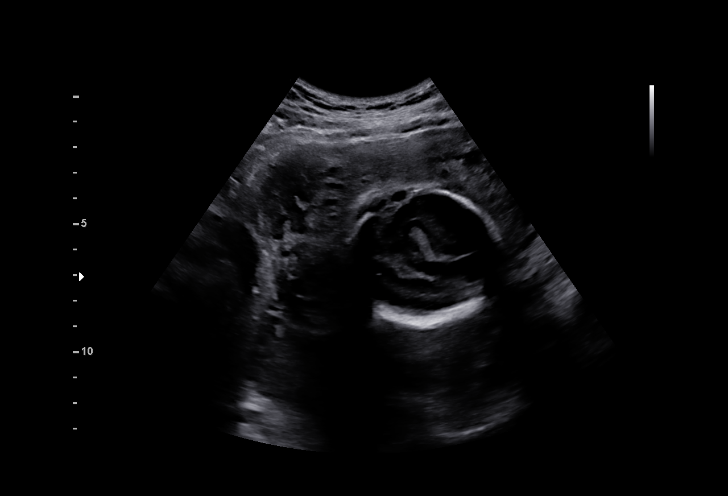
[im 38/40]
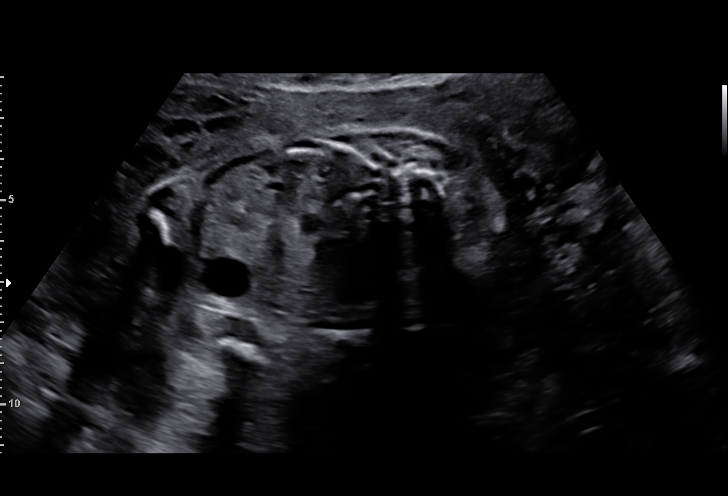

[13 of 28 positions shown; findings below may reference images not displayed]

2  US MFM UA CORD DOPPLER                768[address].02    ANGELL CHAND

Indications

 Oligohydramnios / Decreased amniotic fluid
 volume (Anhydramnios)
 Premature rupture of membranes - leaking
 fluid
 23 weeks gestation of pregnancy
 Uterine fibroid
 Echogenic intracardiac focus of the heart
 (EIF)
 Hypertension - Chronic/Pre-existing (No
 Meds)
 Tobacco use complicating pregnancy,
 second trimester
 Low Risk NIPS(Negative AFP)
 Genetic carrier (Hgb C Carrier)
Fetal Evaluation

 Num Of Fetuses:         1
 Fetal Heart Rate(bpm):  160
 Cardiac Activity:       Observed
 Presentation:           Cephalic
 Placenta:               Posterior
 P. Cord Insertion:      Not well visualized

 Amniotic Fluid
 AFI FV:      Oligohydramnios

                             Largest Pocket(cm)

Biometry

 BPD:      51.4  mm     G. Age:  21w 4d        1.1  %    CI:        71.04   %    70 - 86
                                                         FL/HC:      20.2   %    18.7 -
 HC:      194.3  mm     G. Age:  21w 5d        < 1  %    HC/AC:      1.13        1.05 -
 AC:      172.5  mm     G. Age:  22w 1d          7  %    FL/BPD:     76.5   %    71 - 87
 FL:       39.3  mm     G. Age:  22w 5d         11  %    FL/AC:      22.8   %    20 - 24
 LV:        3.9  mm

 Est. FW:     492  gm      1 lb 1 oz    3.5  %
OB History

 Gravidity:    2         Term:   1        Prem:   0        SAB:   0
 TOP:          0       Ectopic:  0        Living: 1
Gestational Age

 U/S Today:     22w 0d                                        EDD:   11/23/20
 Best:          23w 5d     Det. By:  U/S C R L  (04/12/20)    EDD:   11/11/20
Anatomy

 Cranium:               Appears normal         Aortic Arch:            Not well visualized
 Cavum:                 Previously seen        Ductal Arch:            Not well visualized
 Ventricles:            Appears normal         Diaphragm:              Previously seen
 Choroid Plexus:        Previously seen        Stomach:                Appears normal, left
                                                                       sided
 Cerebellum:            Previously seen        Abdomen:                Previously seen
 Posterior Fossa:       Previously seen        Abdominal Wall:         Not well visualized
 Nuchal Fold:           Previously seen        Cord Vessels:           Appears normal (3
                                                                       vessel cord)
 Face:                  Not well visualized    Kidneys:                Appear normal
 Lips:                  Previously seen        Bladder:                Appears normal
 Thoracic:              Previously seen        Spine:                  Not well visualized
 Heart:                 Appears normal; EIF    Upper Extremities:      Visualized
 RVOT:                  Not well visualized    Lower Extremities:      Visualized
 LVOT:                  Previously seen

 Other:  Technically difficult due to low amniotic fluid.
Doppler - Fetal Vessels

 Umbilical Artery
  S/D     %tile      RI    %tile      PI    %tile            ADFV    RDFV
  3.17       32    0.68       34    1.03       29               No      No

Cervix Uterus Adnexa

 Right Ovary
 Not visualized.

 Left Ovary
 Within normal limits.
Comments

 This patient was seen for a follow up exam due to PPROM at
 19 weeks (about 1 month ago).  The patient has already been
 counseled previously regarding the increased risks of
 pulmonary hypoplasia and stillbirth due to PPROM at such an
 early gestational age.  She reports that she continues to leak
 amniotic fluid.  She denies any fevers or shaking chills or foul-
 smelling discharge.  She reports feeling fetal movements on
 a daily basis.  The patient has already received a complete
 course of antenatal corticosteroids.  She has declined
 inpatient admission as she said that she has too many things
 to do and there is no one else to take care of of her other
 child.
 She was informed that the fetal growth measures at the 4th
 percentile for her gestational age indicating fetal growth
 restriction.  Anhydramnios continues to be noted today.
 Doppler studies of the umbilical arteries performed today
 continues to show normal forward flow.  There were no signs
 of absent or reversed end-diastolic flow noted.
 The patient and her partner were advised that due to
 PPROM, my recommendation would be for her to be
 hospitalized with daily fetal testing until delivery.  They
 continue to decline admission to the hospital.
 The increased risk of stillbirth was discussed again.  She was
 advised to continue to monitor fetal movements on a daily
 basis and to go to the hospital should she complain of
 decreased fetal movements, lower abdominal cramping,
 should she have foul-smelling vaginal discharge, or if she has
 fevers or shaking chills.
 Due to fetal growth restriction, a follow-up umbilical artery
 Doppler study was scheduled in 2 weeks.  We will reassess
 the fetal growth again in 3 weeks.

## 2021-10-25 IMAGING — US US PELVIS COMPLETE WITH TRANSVAGINAL
1 series · 13 of 25 positions shown · non-contrast
Comparison: Prior ultrasound from 01/19/2020

CLINICAL DATA: Initial evaluation for hirsutism.



[Series 1: us pelvis complete with transvaginal · 13 of 108 slices shown]
[im 1/108]
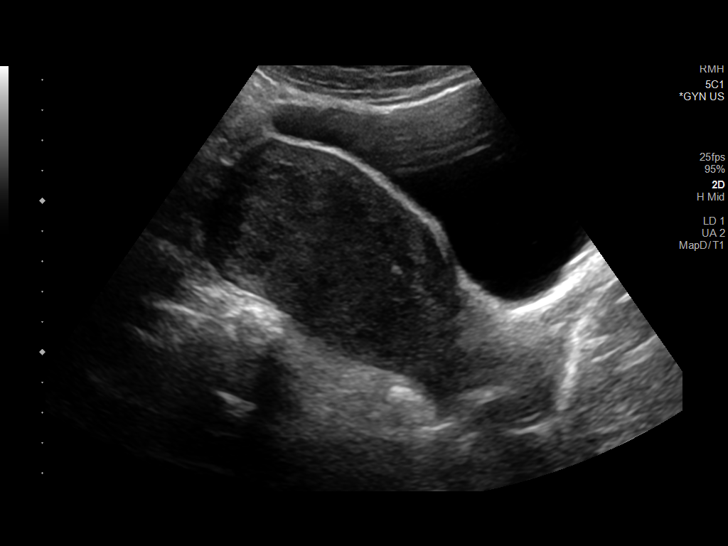
[im 9/108]
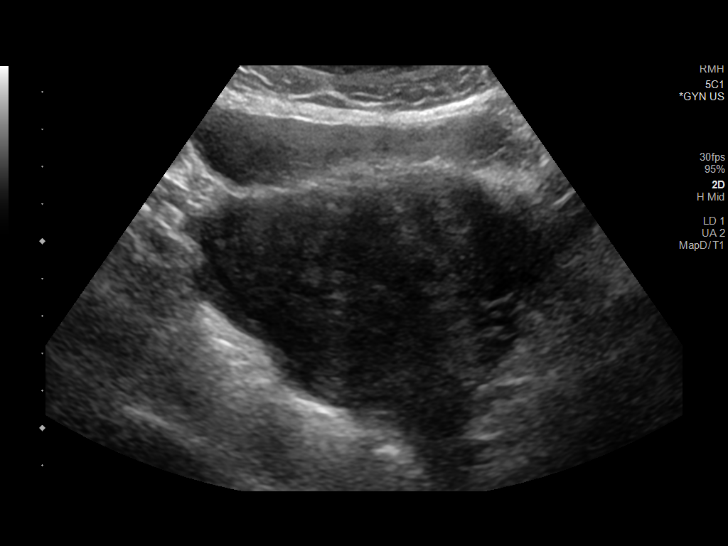
[im 18/108]
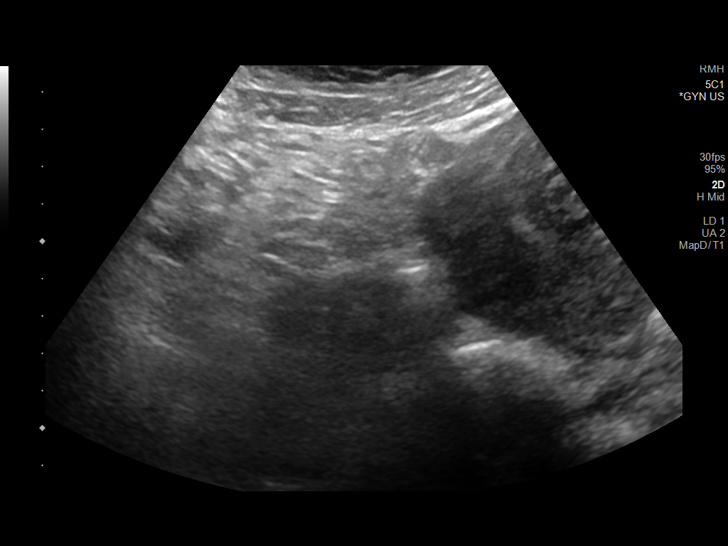
[im 27/108]
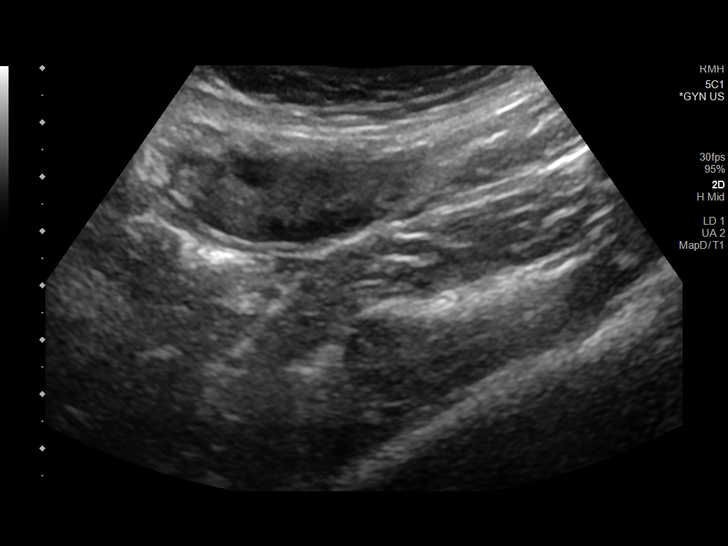
[im 36/108]
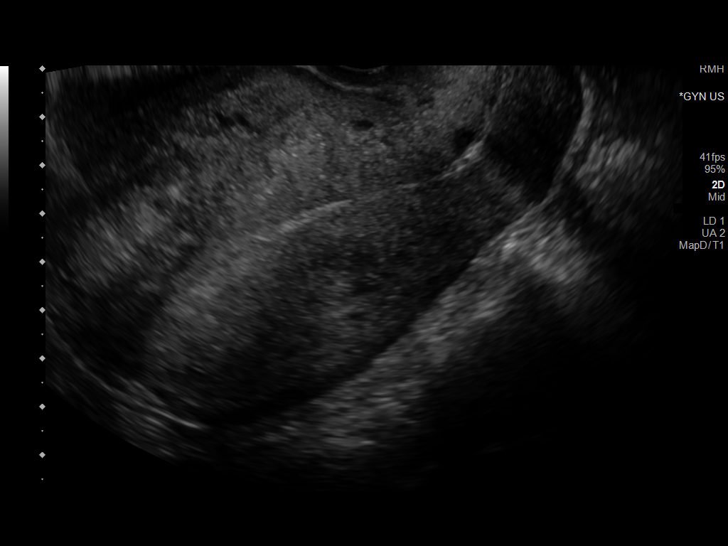
[im 45/108]
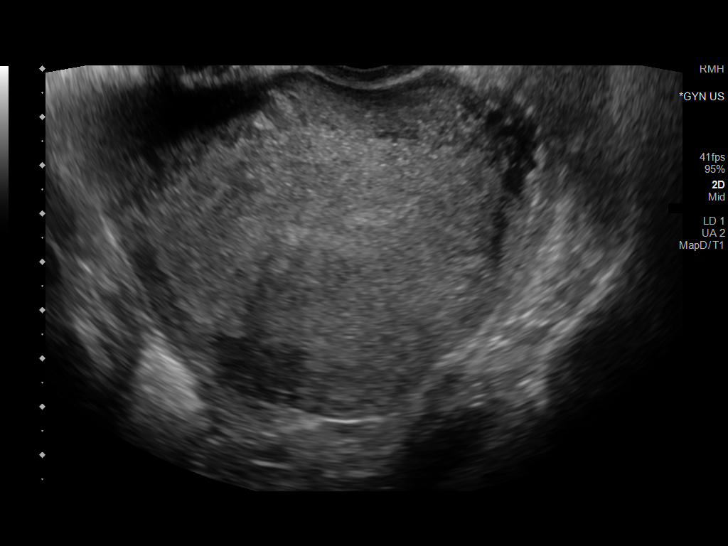
[im 54/108]
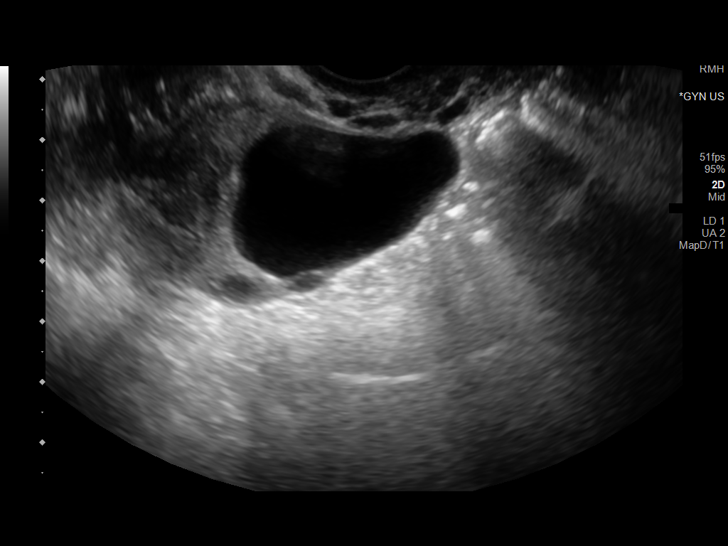
[im 63/108]
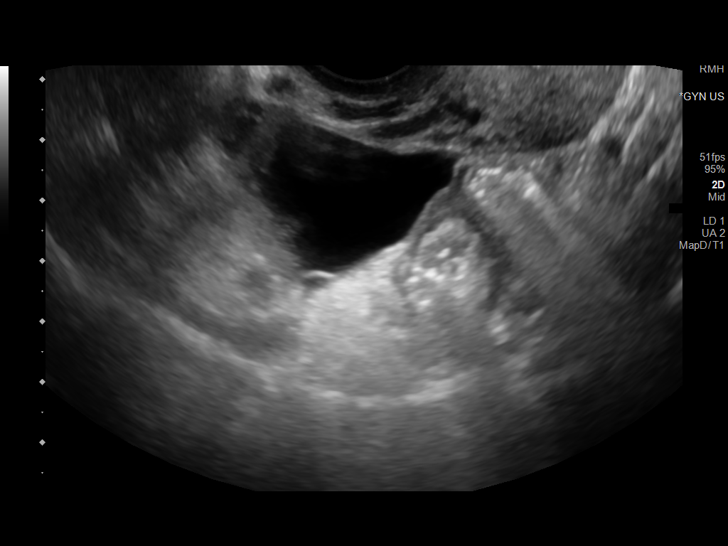
[im 72/108]
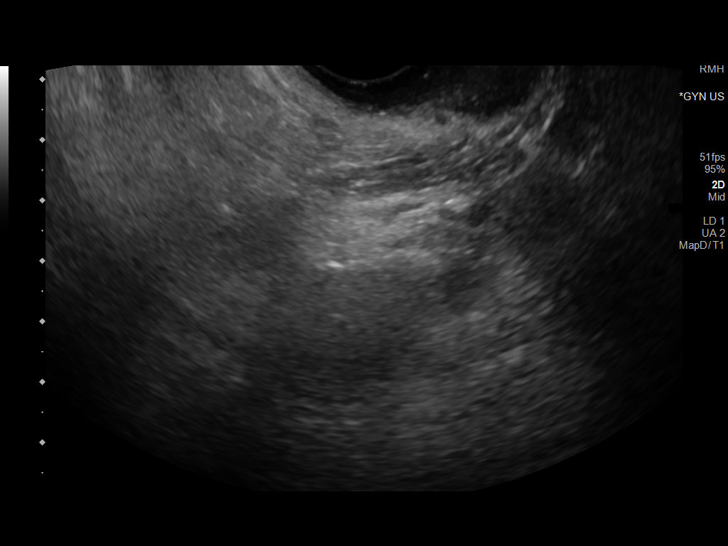
[im 81/108]
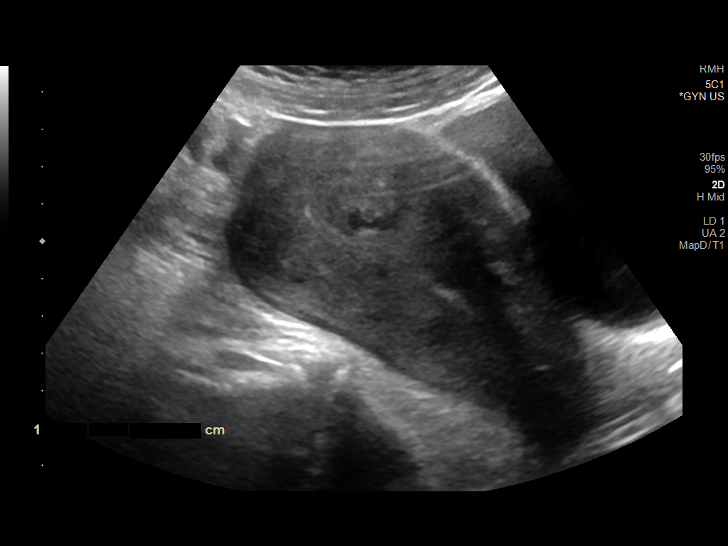
[im 90/108]
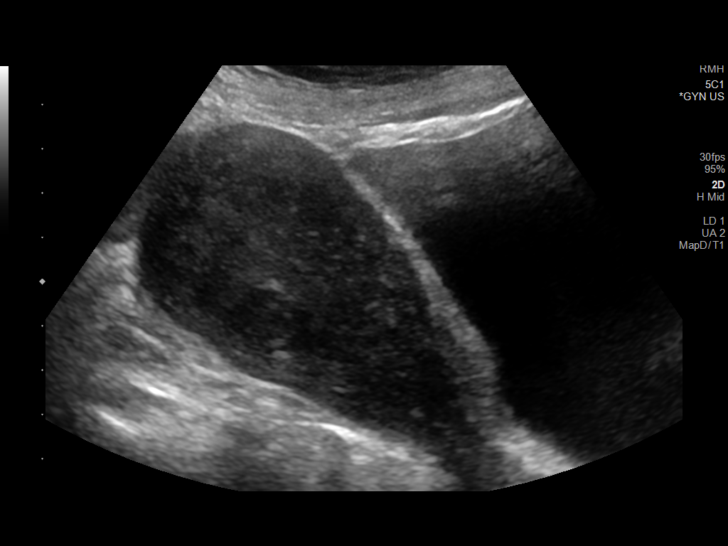
[im 99/108]
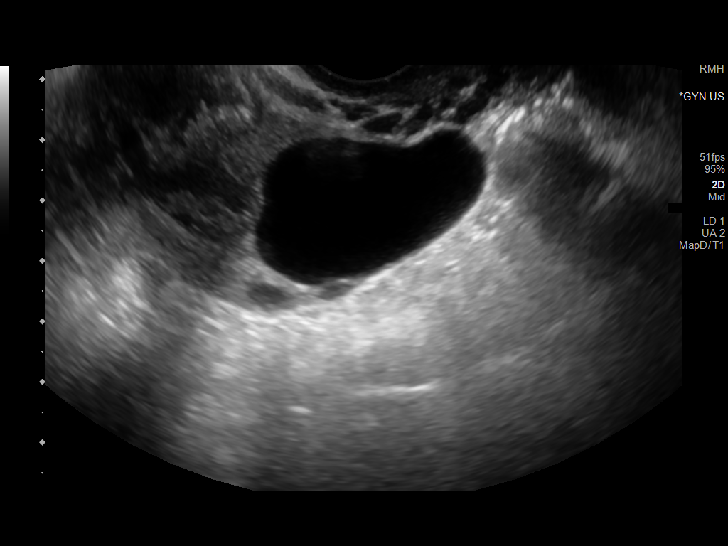
[im 108/108]
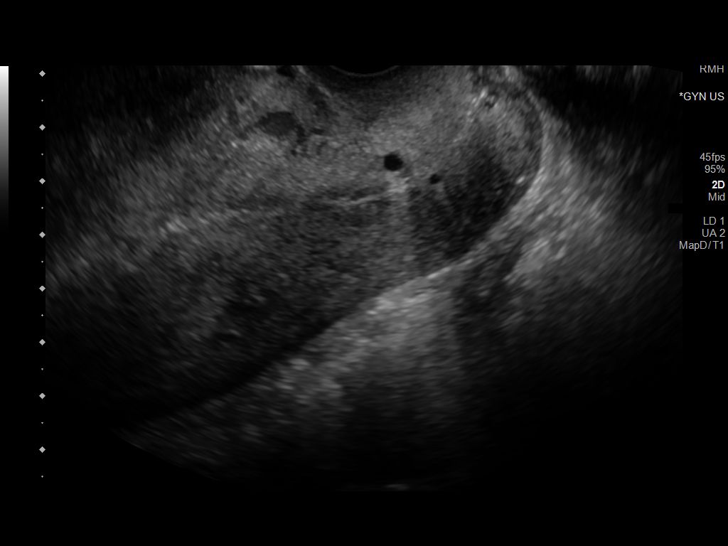

[13 of 25 positions shown; findings below may reference images not displayed]

FINDINGS: Uterus

Measurements: 13.4 x 5.3 x 8.6 cm = volume: 319.0 mL. Uterus is
anteverted. 2.7 x 2.4 x 2.4 cm intramural fibroid present at the
anterior uterine fundus/body. 2.2 x 2.0 x 1.7 cm subserosal fibroid
present at the right posterior uterine body.

Endometrium

Thickness: 3.0 mm.  No focal abnormality visualized.

Right ovary

Measurements: 7.4 x 3.2 x 3.0 cm = volume: 37.0 mL. Complex cyst
measuring 2.2 x 1.8 x 2.4 cm seen within the right ovary. Cyst
demonstrates mixed echogenicity with internal hyperechoic
components, favored to reflect a hemorrhagic cyst with internal
retractile clot. No definite associated vascularity or solid
component. Additional 4.0 x 2.4 x 2.6 cm simple cyst seen as well.
Few internal daughter cysts without additional internal complexity.
Increased number of follicles within the underlying right ovary.

Left ovary

Measurements: 0.9 x 1.9 x 2.7 cm = volume: 13.0 mL. Left ovary only
seen transabdominally, and thus not well evaluated. No adnexal mass.

Other findings

No abnormal free fluid.
IMPRESSION: 1. 2.4 cm complex right ovarian cyst, favored to reflect a
hemorrhagic cyst with internal retractile clot. While this is almost
certainly benign, a short interval follow-up ultrasound in 6-12
weeks could be performed for further evaluation as warranted.
2. Additional 4 cm simple right ovarian cyst, benign in appearance.
No follow up imaging recommended. Note: This recommendation does not
apply to premenarchal patients or to those with increased risk
(genetic, family history, elevated tumor markers or other high-risk
factors) of ovarian cancer. Reference: Radiology [DATE]):359-371.
3. Increased number of follicles within the underlying right ovary,
nonspecific, but can be seen in the setting of polycystic ovarian
syndrome.
4. Enlarged fibroid uterus as detailed above.
